# Patient Record
Sex: Male | Born: 1970 | Race: Black or African American | Hispanic: No | Marital: Single | Smoking: Current every day smoker
Health system: Southern US, Community
[De-identification: ages and names within clinical notes are randomized; demographics above are authoritative.]

## PROBLEM LIST (undated history)

## (undated) DIAGNOSIS — E7800 Pure hypercholesterolemia, unspecified: Secondary | ICD-10-CM

## (undated) DIAGNOSIS — D171 Benign lipomatous neoplasm of skin and subcutaneous tissue of trunk: Secondary | ICD-10-CM

## (undated) DIAGNOSIS — R519 Headache, unspecified: Secondary | ICD-10-CM

## (undated) DIAGNOSIS — N19 Unspecified kidney failure: Secondary | ICD-10-CM

## (undated) DIAGNOSIS — N179 Acute kidney failure, unspecified: Secondary | ICD-10-CM

## (undated) DIAGNOSIS — R51 Headache: Secondary | ICD-10-CM

## (undated) HISTORY — PX: OTHER SURGICAL HISTORY: SHX169

---
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
## 1997-09-01 ENCOUNTER — Emergency Department (HOSPITAL_COMMUNITY): Admission: EM | Admit: 1997-09-01 | Discharge: 1997-09-01 | Payer: Self-pay | Admitting: Emergency Medicine

## 1999-07-13 ENCOUNTER — Emergency Department (HOSPITAL_COMMUNITY): Admission: EM | Admit: 1999-07-13 | Discharge: 1999-07-13 | Payer: Self-pay | Admitting: Emergency Medicine

## 2001-11-10 ENCOUNTER — Emergency Department (HOSPITAL_COMMUNITY): Admission: EM | Admit: 2001-11-10 | Discharge: 2001-11-10 | Payer: Self-pay | Admitting: Emergency Medicine

## 2003-07-22 ENCOUNTER — Emergency Department (HOSPITAL_COMMUNITY): Admission: EM | Admit: 2003-07-22 | Discharge: 2003-07-22 | Payer: Self-pay | Admitting: Emergency Medicine

## 2004-06-27 ENCOUNTER — Emergency Department (HOSPITAL_COMMUNITY): Admission: EM | Admit: 2004-06-27 | Discharge: 2004-06-27 | Payer: Self-pay | Admitting: Family Medicine

## 2004-06-29 ENCOUNTER — Emergency Department (HOSPITAL_COMMUNITY): Admission: EM | Admit: 2004-06-29 | Discharge: 2004-06-29 | Payer: Self-pay | Admitting: Family Medicine

## 2005-11-27 ENCOUNTER — Emergency Department (HOSPITAL_COMMUNITY): Admission: EM | Admit: 2005-11-27 | Discharge: 2005-11-27 | Payer: Self-pay | Admitting: Emergency Medicine

## 2005-12-04 ENCOUNTER — Ambulatory Visit: Payer: Self-pay | Admitting: Nurse Practitioner

## 2005-12-06 ENCOUNTER — Ambulatory Visit (HOSPITAL_COMMUNITY): Admission: RE | Admit: 2005-12-06 | Discharge: 2005-12-06 | Payer: Self-pay | Admitting: Family Medicine

## 2006-01-01 ENCOUNTER — Ambulatory Visit (HOSPITAL_BASED_OUTPATIENT_CLINIC_OR_DEPARTMENT_OTHER): Admission: RE | Admit: 2006-01-01 | Discharge: 2006-01-01 | Payer: Self-pay | Admitting: General Surgery

## 2006-01-01 ENCOUNTER — Encounter (INDEPENDENT_AMBULATORY_CARE_PROVIDER_SITE_OTHER): Payer: Self-pay | Admitting: *Deleted

## 2006-10-25 ENCOUNTER — Emergency Department (HOSPITAL_COMMUNITY): Admission: EM | Admit: 2006-10-25 | Discharge: 2006-10-25 | Payer: Self-pay | Admitting: Emergency Medicine

## 2010-03-26 ENCOUNTER — Emergency Department (HOSPITAL_COMMUNITY)
Admission: EM | Admit: 2010-03-26 | Discharge: 2010-03-26 | Payer: Self-pay | Source: Home / Self Care | Admitting: Emergency Medicine

## 2010-07-05 ENCOUNTER — Inpatient Hospital Stay (INDEPENDENT_AMBULATORY_CARE_PROVIDER_SITE_OTHER)
Admission: RE | Admit: 2010-07-05 | Discharge: 2010-07-05 | Disposition: A | Discharge status: Home / Self Care | Payer: Self-pay | Source: Ambulatory Visit | Attending: Family Medicine | Admitting: Family Medicine

## 2010-07-05 DIAGNOSIS — R6889 Other general symptoms and signs: Secondary | ICD-10-CM

## 2010-07-06 LAB — GC/CHLAMYDIA PROBE AMP, GENITAL: Chlamydia, DNA Probe: NEGATIVE

## 2010-07-13 NOTE — Op Note (Signed)
NAMELARZ, MARK                 ACCOUNT NO.:  [id number]   MEDICAL RECORD NO.:  [id number]          PATIENT TYPE:  AMB   LOCATION:  [facility]                         FACILITY:  [facility]   PHYSICIAN:  Anselm Pancoast. Weatherly, M.D.DATE OF BIRTH:  1970/09/09   DATE OF PROCEDURE:  01/01/2006  DATE OF DISCHARGE:                                 OPERATIVE REPORT   PREOPERATIVE DIAGNOSIS:  Intramuscular large lipoma, abdominal wall, right  lower quadrant.   OPERATION:  Excision of large intramuscular lipoma.   General anesthesia.   SURGEON:  Anselm Pancoast. Zachery Dakins, M.D.   HISTORY:  Charles Wise is a 40 year old male who for the last year or so, he  has noticed an obvious bulge at the right groin when he would strain.  I  think he was incarcerated recently because of child support and was doing  some heavy lifting [place].  He said he had pain in the  area.  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  He was seen in the ER and he has also  been seen at [facility], and someone had ordered a CT that showed this is a  large lipoma located deep to the external oblique, under the internal  oblique but more lateral than a usual spigelian hernia area, and looked like  a large only lipoma.  There was no evidence of any loops of bowel or  anything in the area.  He was referred to my office after having the CT, and  he was seen, I think, actually at [facility], I am not sure why they  did a CT at that location, and I saw him in the office earlier in the week.  This is an area about the size of a lemon that is obviously lateral to the  rectus, located down a little higher than the true inguinal hernia, kind of  in the area of McBurney's area.  I recommended that we remove this under  general anesthesia.  If we would actually find a spigelian hernia, I  discussed about the possibilities of placing mesh but on the CT, there is  no  evidence of any loops of bowel up in the intra-abdominal area and also on  physical exam, you cannot feel any squishy like an actual hernia but you can  feel definitely this fatty consistency with straining.  His laboratory  studies are normal and he has not had any nausea or vomiting or other kind  of intra-abdominal-type symptoms.   Preoperatively he was given a gram of Ancef, induction of general anesthesia  with an LOA tube.  The area had been marked.  I initialized the area and the  permit, etc. were signed.  I thin made an oblique incision just like,  really, you are doing an appendectomy down through the thin layer of adipose  tissue.  Then the external oblique was opened right over the area and then  using an [employer] retractor, you could feel obviously the lipoma, and  this,  the most lateral little finger path of the area goes up under the internal  oblique.  The transversalis is definitely deep to the area and the area was  completely separated from the surrounding muscles.  The blood supply, etc.,  comes out on the inferior aspect, which was clamped between [facility] and  ligated with 3-0 Vicryl, and then after the area was completely excised, I  found no evidence of any hernia sac or evidence of a spigelian hernia.  I  closed the deeper layers, sort of reinforcing the internal oblique, kind of  pulling the muscles together with some 2-0 Vicryl sutures and then closed  the external oblique that was open about probably a 3 inch incision with an  0 Vicryl running suture.  Scarpa fascia was approximated with 4-0 Vicryl and  4-0 Vicryl subcuticular sutures and then Benzoin and Steri-Strips on the  skin.  I had anesthetized the muscle layer with approximately 20 mL of 0.25%  Marcaine with adrenalin for immediate postoperative pain and the patient  tolerated the procedure nicely.  He will be released after a short  stay, and he is trying to seek additional work, probably as a  Copy, but I  would like him to wait about 3 weeks before he resumes kind of strenuous  activities.  The patient will be given Vicodin and no additional antibiotics  will be needed.  I will see him back in the office next week.           ______________________________  Anselm Pancoast. Zachery Dakins, M.D.     WJW/MEDQ  D:  01/01/2006  T:  01/01/2006  Job:  [id number]   cc:   Maurice March, M.D.  Fax: [phone]

## 2010-12-07 LAB — RAPID URINE DRUG SCREEN, HOSP PERFORMED
Amphetamines: NOT DETECTED
Barbiturates: NOT DETECTED
Benzodiazepines: NOT DETECTED

## 2011-03-05 ENCOUNTER — Encounter: Payer: Self-pay | Admitting: Cardiology

## 2011-03-05 ENCOUNTER — Emergency Department (INDEPENDENT_AMBULATORY_CARE_PROVIDER_SITE_OTHER)
Admission: EM | Admit: 2011-03-05 | Discharge: 2011-03-05 | Disposition: A | Discharge status: Home / Self Care | Payer: Self-pay | Source: Home / Self Care

## 2011-03-05 DIAGNOSIS — S46219A Strain of muscle, fascia and tendon of other parts of biceps, unspecified arm, initial encounter: Secondary | ICD-10-CM

## 2011-03-05 DIAGNOSIS — S43499A Other sprain of unspecified shoulder joint, initial encounter: Secondary | ICD-10-CM

## 2011-03-05 MED ORDER — IBUPROFEN 800 MG PO TABS: 800.0000 mg | ORAL_TABLET | Freq: Three times a day (TID) | ORAL | Status: AC

## 2011-03-05 MED ORDER — TRAMADOL HCL 50 MG PO TABS: 50.0000 mg | ORAL_TABLET | Freq: Four times a day (QID) | ORAL | Status: AC | PRN

## 2011-03-05 NOTE — ED Notes (Signed)
Left arm pain and shoulder pain that started yesterday. Pt notices decreased ROM. Any movement causes more pain. Denies injury. Pain started after lifting plates and a couch bed. Pt points to inner aspect of left forearm and states it is numb. Normal sensation to the posterior side of the arm.

## 2011-03-05 NOTE — ED Provider Notes (Signed)
Medical screening examination/treatment/procedure(s) were performed by non-physician practitioner and as supervising physician I was immediately available for consultation/collaboration.  Corrie Mckusick, MD 03/05/11 2213

## 2011-03-05 NOTE — ED Provider Notes (Signed)
History     CSN: [id number]  Arrival date & time 03/05/11  1707   None     Chief Complaint  Patient presents with  . Shoulder Pain  . Arm Pain    (Consider location/radiation/quality/duration/timing/severity/associated sxs/prior treatment) HPI Comments: Pain began 2 days ago Lt anterior elbow area. Worsened yesterday. Pain sometimes radiates up arm to shoulder area. Pain is worsened with movement of elbow. Pt reports numbness of ventral forearm only to touch. Denies injury but pain began the day after helping a family member move and doing heavy lifting. He has not taken anything for his discomfort.  The history is provided by the patient.    History reviewed. No pertinent past medical history.  History reviewed. No pertinent past surgical history.  History reviewed. No pertinent family history.  History  Substance Use Topics  . Smoking status: Not on file  . Smokeless tobacco: Not on file  . Alcohol Use: Not on file      Review of Systems  HENT: Negative for neck pain.   Musculoskeletal: Negative for back pain and joint swelling.  Neurological: Negative for weakness.    Allergies  Review of patient's allergies indicates no known allergies.  Home Medications   Current Outpatient Rx  Name Route Sig Dispense Refill  . IBUPROFEN 800 MG PO TABS Oral Take 1 tablet (800 mg total) by mouth 3 (three) times daily. 15 tablet 0  . TRAMADOL HCL 50 MG PO TABS Oral Take 1 tablet (50 mg total) by mouth every 6 (six) hours as needed for pain. 8 tablet 0    BP 106/55  Pulse 59  Temp(Src) 98.2 F (36.8 C) (Oral)  Resp 12  SpO2 98%  Physical Exam  Nursing note and vitals reviewed. Constitutional: He appears well-developed and well-nourished. No distress.  Cardiovascular: Normal rate, regular rhythm and normal heart sounds.   Pulses:      Radial pulses are 2+ on the right side, and 2+ on the left side.       Cap refill < 2 sec bilat.   Pulmonary/Chest: Effort normal and  breath sounds normal. No respiratory distress.  Musculoskeletal:       Left elbow: He exhibits decreased range of motion (pt refuses flexion due to pain). He exhibits no swelling, no effusion, no deformity and no laceration. tenderness (TTP distal bicep) found. No radial head, no medial epicondyle, no lateral epicondyle and no olecranon process tenderness noted.       Rt proximal forearm measures 18.5 cm, and Lt proximal forearm measures 18.0 cm.   Neurological: He is alert.  Skin: Skin is warm and dry. No erythema.  Psychiatric: He has a normal mood and affect.    ED Course  Procedures (including critical care time)  Labs Reviewed - No data to display No results found.   1. Sprain, bicep       MDM  Lt anterior elbow pain with tender bicep. Onset of pain day after heavy lifting.         Melody Comas, [place] 03/05/11 2046

## 2011-03-08 NOTE — ED Notes (Signed)
Pt. called and requested a " referral." He said he showed his papers to his boss but it did not have a " referral." I asked him if he meant a work note and he said yes. I asked him if he asked the PA for one when he was here and he said yes but she did not give him one. Discussed with Esperanza Sheets and she said the pt. told her he was off work until [employer]. So it was not necessary to do work note. If note is needed to have pt. return to work 1/10 without restrictions. Pt. notified what note will say and he can pick it up at the front desk. Vassie Moselle 03/08/2011

## 2011-10-11 ENCOUNTER — Encounter (HOSPITAL_COMMUNITY): Payer: Self-pay | Admitting: *Deleted

## 2011-10-11 ENCOUNTER — Emergency Department (INDEPENDENT_AMBULATORY_CARE_PROVIDER_SITE_OTHER)
Admission: EM | Admit: 2011-10-11 | Discharge: 2011-10-11 | Disposition: A | Discharge status: Nursing Home (Includes ICF) | Payer: Self-pay | Source: Home / Self Care | Attending: Family Medicine | Admitting: Family Medicine

## 2011-10-11 ENCOUNTER — Emergency Department (HOSPITAL_COMMUNITY)
Admission: EM | Admit: 2011-10-11 | Discharge: 2011-10-11 | Disposition: A | Discharge status: Home / Self Care | Payer: Self-pay | Attending: Emergency Medicine | Admitting: Emergency Medicine

## 2011-10-11 DIAGNOSIS — F17200 Nicotine dependence, unspecified, uncomplicated: Secondary | ICD-10-CM | POA: Insufficient documentation

## 2011-10-11 DIAGNOSIS — M542 Cervicalgia: Secondary | ICD-10-CM | POA: Insufficient documentation

## 2011-10-11 DIAGNOSIS — M5382 Other specified dorsopathies, cervical region: Secondary | ICD-10-CM | POA: Insufficient documentation

## 2011-10-11 DIAGNOSIS — M79609 Pain in unspecified limb: Secondary | ICD-10-CM | POA: Insufficient documentation

## 2011-10-11 DIAGNOSIS — R51 Headache: Secondary | ICD-10-CM

## 2011-10-11 LAB — CBC WITH DIFFERENTIAL/PLATELET
Hemoglobin: 14.4 g/dL (ref 13.0–17.0)
Lymphocytes Relative: 39 % (ref 12–46)
Lymphs Abs: 3.3 10*3/uL (ref 0.7–4.0)
Monocytes Relative: 11 % (ref 3–12)
Neutro Abs: 3.8 10*3/uL (ref 1.7–7.7)
Neutrophils Relative %: 46 % (ref 43–77)
Platelets: 249 10*3/uL (ref 150–400)
RBC: 4.66 MIL/uL (ref 4.22–5.81)
WBC: 8.3 10*3/uL (ref 4.0–10.5)

## 2011-10-11 LAB — BASIC METABOLIC PANEL
BUN: 10 mg/dL (ref 6–23)
CO2: 26 mEq/L (ref 19–32)
Chloride: 104 mEq/L (ref 96–112)
GFR calc non Af Amer: >90 mL/min (ref >90)
Glucose, Bld: 80 mg/dL (ref 70–99)
Potassium: 4.5 mEq/L (ref 3.5–5.1)
Sodium: 139 mEq/L (ref 135–145)

## 2011-10-11 MED ORDER — IBUPROFEN 200 MG PO TABS: 600.0000 mg | ORAL_TABLET | Freq: Four times a day (QID) | ORAL | Status: AC | PRN

## 2011-10-11 MED ORDER — METOCLOPRAMIDE HCL 5 MG/ML IJ SOLN
10.0000 mg | Freq: Once | INTRAMUSCULAR | Status: AC
  Administered 2011-10-11: 10 mg via INTRAVENOUS
  Filled 2011-10-11: qty 2

## 2011-10-11 MED ORDER — KETOROLAC TROMETHAMINE 30 MG/ML IJ SOLN
30.0000 mg | Freq: Once | INTRAMUSCULAR | Status: AC
  Administered 2011-10-11: 30 mg via INTRAVENOUS
  Filled 2011-10-11: qty 1

## 2011-10-11 NOTE — ED Notes (Signed)
pT  REPORTS  4  DAYS  OF  THE  WORST  HEADACHE  OF  HIS  LIFE  UNRELEIVED  BY  OTC  MEDS     REPORTS  SOME   NAUSEA  NO  VOMITING    SLIGHT  PHOTOSENSITIVITY  R  EYE        STES  BACK OF  NECK SEEMS  STIFF   PEARLA

## 2011-10-11 NOTE — ED Provider Notes (Signed)
History     CSN: [id number]  Arrival date & time 10/11/11  1509   First MD Initiated Contact with Patient 10/11/11 1527      Chief Complaint  Patient presents with  . Headache    (Consider location/radiation/quality/duration/timing/severity/associated sxs/prior treatment) Patient is a 41 y.o. male presenting with headaches. The history is provided by the patient.  Headache The primary symptoms include headaches and nausea. Primary symptoms do not include dizziness or fever. The symptoms began 3 to 5 days ago. The symptoms are worsening. The neurological symptoms are diffuse.  The headache is associated with neck stiffness. The headache is not associated with aura or photophobia.  Additional symptoms include neck stiffness. Additional symptoms do not include photophobia or aura.    History reviewed. No pertinent past medical history.  History reviewed. No pertinent past surgical history.  No family history on file.  History  Substance Use Topics  . Smoking status: Current Everyday Smoker -- 1.0 packs/day  . Smokeless tobacco: Not on file  . Alcohol Use: No      Review of Systems  Constitutional: Positive for activity change and appetite change. Negative for fever and fatigue.  HENT: Positive for neck stiffness.   Eyes: Negative for photophobia.  Respiratory: Negative.   Cardiovascular: Negative.   Gastrointestinal: Positive for nausea.  Neurological: Positive for headaches. Negative for dizziness, syncope and speech difficulty.    Allergies  Review of patient's allergies indicates no known allergies.  Home Medications  No current outpatient prescriptions on file.  BP 117/53  Pulse 67  Temp 98.4 F (36.9 C) (Oral)  Resp 16  SpO2 100%  Physical Exam  Nursing note and vitals reviewed. Constitutional: He is oriented to person, place, and time. He appears well-developed and well-nourished. He appears distressed.  HENT:  Head: Normocephalic.  Right Ear:  External ear normal.  Left Ear: External ear normal.  Mouth/Throat: Oropharynx is clear and moist.  Eyes: Conjunctivae and EOM are normal.  Neck: Trachea normal and normal range of motion. Muscular tenderness present. Carotid bruit is not present. No rigidity. Kernig's sign noted. No Brudzinski's sign noted.  Musculoskeletal: Normal range of motion.  Neurological: He is alert and oriented to person, place, and time. No cranial nerve deficit. Coordination normal.  Skin: Skin is warm and dry.    ED Course  Procedures (including critical care time)  Labs Reviewed - No data to display No results found.   No diagnosis found.    MDM          Linna Hoff, MD 10/11/11 [phone]

## 2011-10-11 NOTE — ED Provider Notes (Signed)
History     CSN: [id number]  Arrival date & time 10/11/11  1557   First MD Initiated Contact with Patient 10/11/11 1813      Chief Complaint  Patient presents with  . Headache  . Neck Pain  . Leg Pain    numbness    (Consider location/radiation/quality/duration/timing/severity/associated sxs/prior treatment) HPI This is a 41 year old man without any significant past medical history who presents with worsening headaches for 3-4 days. He reports that the headaches are associated with neck pain that stretches all the way down to his shoulders bilaterally. He denies history of fever, but reports feeling a bit chilly this morning. The headache is about 8/10 and increased by rapid change of position. It is mainly frontal and slow and pounding in nature and sharp. He has no history of vomiting, but reports feeling nauseated. There is no change in appetite. Patient denies history of photophobia, but reports feeling numbness in his lower extremities bilaterally. He does not have dizziness, loss of consciousness or seizures. He has tried using over-the-counter medicines, including Tylenol, and aspirin without any improvement in his headache. This is the first time he has had such headaches. However, he reports that he has in the past experienced migraine headaches, but they have not been like this. He denies any rash, history of recent travel or sick contacts. No history of head trauma.    History reviewed. No pertinent past medical history.  History reviewed. No pertinent past surgical history.  History reviewed. No pertinent family history.  History  Substance Use Topics  . Smoking status: Current Everyday Smoker -- 1.0 packs/day  . Smokeless tobacco: Not on file  . Alcohol Use: No      Review of Systems  Constitutional: Positive for appetite change and fatigue.  HENT: Negative for hearing loss, ear pain, nosebleeds, congestion, sore throat, facial swelling, rhinorrhea, sneezing,  drooling, mouth sores, trouble swallowing, dental problem, voice change, postnasal drip, sinus pressure, tinnitus and ear discharge.   Eyes: Negative for visual disturbance.  Respiratory: Negative.  Negative for apnea.   Cardiovascular: Negative.   Gastrointestinal: Negative for vomiting, abdominal pain, abdominal distention and rectal pain.  Genitourinary: Negative.   Musculoskeletal: Negative for myalgias, back pain, joint swelling, arthralgias and gait problem.  Neurological: Positive for dizziness and light-headedness. Negative for tremors, seizures, syncope, facial asymmetry, speech difficulty and weakness.  Psychiatric/Behavioral: Negative.     Allergies  Review of patient's allergies indicates no known allergies.  Home Medications  No current outpatient prescriptions on file.  BP 131/42  Pulse 58  Temp 98.3 F (36.8 C) (Oral)  Resp 16  SpO2 99%  Physical Exam  Constitutional: He is oriented to person, place, and time. He appears well-developed and well-nourished.  HENT:  Head: Normocephalic and atraumatic.  Eyes: Conjunctivae and EOM are normal. Pupils are equal, round, and reactive to light.  Neck: Normal range of motion. Muscular tenderness present. Rigidity present. No edema, no erythema and normal range of motion present. No Kernig's sign noted.    Cardiovascular: Normal rate, regular rhythm and normal heart sounds.  Exam reveals no gallop and no friction rub.   No murmur heard. Abdominal: Soft. Bowel sounds are normal.  Musculoskeletal: He exhibits no edema and no tenderness.  Neurological: He is alert and oriented to person, place, and time. He displays no atrophy, no tremor and normal reflexes. No sensory deficit. He exhibits normal muscle tone. He displays no seizure activity. GCS eye subscore is 4. GCS verbal subscore is  5. GCS motor subscore is 6.  Skin: Skin is warm and dry. No rash noted.  Psychiatric: He has a normal mood and affect. His behavior is normal.  Thought content normal.    ED Course  Procedures (including critical care time)  Labs Reviewed - No data to display No results found.   No diagnosis found.    MDM  This is a 41 year old man, who presents with a 3 days history of headache with neck pain. He does not report photophobia, chills, or fevers. He has had nausea, but no vomiting. Examination reveals neck stiffness, but no focal neurological deficits, Kernig's sign is negative. Patient's presentation and physical exam, his concerning for a meningitis versus intracranial bleed. However, in this patient both these conditions are unlikely given normal a mental status; no fever or chills to support meningitis and no risk factors or history of head trauma to support intracranial bleed. He does not look toxic. The most likely explanation in this patient is a primary headache disorder and musculoskeletal related headaches. I have prescribed it Ketocolac plus metoclopramide intravenously. I have discussed the management of this patient with Dr. Janey Genta and we agree that we don't need to do further work up for a meningitis or intracranial bleeding with a CT scan or LP at this point. Will proceed and do basic labs including a CBC, BMET, and aPTT/PT.  Dow Adolph, MD 10/11/11 1952  9:41 PM  The patient reports that his pain has significantly reduced after receiving IV metoclopramide and Ketocolac. Patient will be discharged with Motrin and an ice pack.  Dow Adolph, MD 10/11/11 2142

## 2011-10-11 NOTE — ED Notes (Signed)
Pt reports 3 day hx of headache, states started at right temporal and has moved over, states headache unrelieved by OTC meds. Pt reports stiff neck and numbness to bilateral legs. Denies any injuries. Pt sent over from urgent care for further eval. No fevers.

## 2011-10-12 NOTE — ED Provider Notes (Signed)
I saw and evaluated the patient, reviewed the resident's note and I agree with the findings and plan.  Pt comes in with cc of headaches. No concerns for life threatening secondary headaches because of no neuro findings, no meningeal signs and no fevers, chills. Pt does have some medial scapular region tenderness, worse with palpation. Likely a tension h/a. Headaches is 2/10 at MD eval, came in with a severity of 7/10. Will d/c.  Derwood Kaplan, MD 10/12/11 [phone]

## 2012-12-21 ENCOUNTER — Emergency Department (HOSPITAL_COMMUNITY)
Admission: EM | Admit: 2012-12-21 | Discharge: 2012-12-21 | Disposition: A | Discharge status: Home / Self Care | Payer: Self-pay | Attending: Emergency Medicine | Admitting: Emergency Medicine

## 2012-12-21 ENCOUNTER — Encounter (HOSPITAL_COMMUNITY): Payer: Self-pay | Admitting: Emergency Medicine

## 2012-12-21 DIAGNOSIS — R109 Unspecified abdominal pain: Secondary | ICD-10-CM

## 2012-12-21 DIAGNOSIS — F439 Reaction to severe stress, unspecified: Secondary | ICD-10-CM

## 2012-12-21 DIAGNOSIS — F17200 Nicotine dependence, unspecified, uncomplicated: Secondary | ICD-10-CM | POA: Insufficient documentation

## 2012-12-21 DIAGNOSIS — R1032 Left lower quadrant pain: Secondary | ICD-10-CM | POA: Insufficient documentation

## 2012-12-21 DIAGNOSIS — F430 Acute stress reaction: Secondary | ICD-10-CM | POA: Insufficient documentation

## 2012-12-21 DIAGNOSIS — N529 Male erectile dysfunction, unspecified: Secondary | ICD-10-CM | POA: Insufficient documentation

## 2012-12-21 LAB — CBC
HCT: 45.4 % (ref 39.0–52.0)
Hemoglobin: 15.5 g/dL (ref 13.0–17.0)
MCV: 89.5 fL (ref 78.0–100.0)
Platelets: 236 10*3/uL (ref 150–400)
RBC: 5.07 MIL/uL (ref 4.22–5.81)
WBC: 5.7 10*3/uL (ref 4.0–10.5)

## 2012-12-21 LAB — COMPREHENSIVE METABOLIC PANEL
AST: 23 U/L (ref 0–37)
CO2: 26 mEq/L (ref 19–32)
Chloride: 100 mEq/L (ref 96–112)
Creatinine, Ser: 0.87 mg/dL (ref 0.50–1.35)
GFR calc Af Amer: >90 mL/min (ref >90)
GFR calc non Af Amer: >90 mL/min (ref >90)
Glucose, Bld: 86 mg/dL (ref 70–99)
Total Bilirubin: 0.4 mg/dL (ref 0.3–1.2)

## 2012-12-21 LAB — URINALYSIS, ROUTINE W REFLEX MICROSCOPIC
Bilirubin Urine: NEGATIVE
Hgb urine dipstick: NEGATIVE
Protein, ur: NEGATIVE mg/dL
Urobilinogen, UA: 0.2 mg/dL (ref 0.0–1.0)

## 2012-12-21 NOTE — ED Notes (Signed)
Pt reports abdominal pain since Sat. Also sts he needs help with erectile dysfunction.

## 2012-12-21 NOTE — Progress Notes (Signed)
[facility] provided pt with a list of primary care resources.  °

## 2012-12-21 NOTE — ED Provider Notes (Signed)
CSN: [id number]     Arrival date & time 12/21/12  1446 History   First MD Initiated Contact with Patient 12/21/12 1505     Chief Complaint  Patient presents with  . Abdominal Pain  . Erectile Dysfunction   (Consider location/radiation/quality/duration/timing/severity/associated sxs/prior Treatment) Patient is a 42 y.o. male presenting with abdominal pain and erectile dysfunction. The history is provided by the patient.  Abdominal Pain Associated symptoms: no chest pain, no cough, no fever, no shortness of breath, no sore throat and no vomiting   Erectile Dysfunction Associated symptoms include abdominal pain. Pertinent negatives include no chest pain, no headaches and no shortness of breath.  pt c/o intermittent left lower abdominal pain for the past 2-3 days. Dull, cramping, mild, comes and goes without specific exacerbating or alleviating factors. No radiation. Normal appetite. No nv. No diarrhea or constipation. States bms a little irregular lately, but had bm today, appeared normal. No abd distension or prior abd surgery. No dysuria or hematuria. No back or flank pain. No fever or chills. Also states for 'long while' some problems w ED. States is able to get erection but then early during intercourse looses erection. States around onset symptoms had experienced loss of job, and has been thinking a lot about that lately, +stress. Denies depression. Denies any medication or drug use. No back pain. No perineal or leg numbness/weakness. No incontincence.      History reviewed. No pertinent past medical history. History reviewed. No pertinent past surgical history. History reviewed. No pertinent family history. History  Substance Use Topics  . Smoking status: Current Every Day Smoker -- 1.00 packs/day  . Smokeless tobacco: Not on file  . Alcohol Use: No    Review of Systems  Constitutional: Negative for fever.  HENT: Negative for sore throat.   Eyes: Negative for redness.   Respiratory: Negative for cough and shortness of breath.   Cardiovascular: Negative for chest pain.  Gastrointestinal: Positive for abdominal pain. Negative for vomiting.  Genitourinary: Negative for flank pain, scrotal swelling, penile pain and testicular pain.  Musculoskeletal: Negative for back pain and neck pain.  Skin: Negative for rash.  Neurological: Negative for weakness, numbness and headaches.  Hematological: Does not bruise/bleed easily.  Psychiatric/Behavioral: Negative for confusion.    Allergies  Review of patient's allergies indicates no known allergies.  Home Medications  No current outpatient prescriptions on file. BP 118/68  Pulse 69  Temp(Src) 98.3 F (36.8 C)  Resp 18  SpO2 98% Physical Exam  Nursing note and vitals reviewed. Constitutional: He is oriented to person, place, and time. He appears well-developed and well-nourished. No distress.  HENT:  Head: Atraumatic.  Mouth/Throat: Oropharynx is clear and moist.  Eyes: Conjunctivae are normal. Pupils are equal, round, and reactive to light. No scleral icterus.  Neck: Neck supple. No tracheal deviation present.  Cardiovascular: Normal rate and intact distal pulses.   Pulmonary/Chest: Effort normal and breath sounds normal. No accessory muscle usage. No respiratory distress.  Abdominal: Soft. Bowel sounds are normal. He exhibits no distension and no mass. There is no tenderness. There is no rebound and no guarding.  Genitourinary:  No cva tenderness.   Musculoskeletal: Normal range of motion. He exhibits no edema and no tenderness.  Neurological: He is alert and oriented to person, place, and time.  Steady gait  Skin: Skin is warm and dry.  Psychiatric: He has a normal mood and affect.    ED Course  Procedures (including critical care time) Labs Review  Results for orders placed during the hospital encounter of 12/21/12  CBC      Result Value Range   WBC 5.7  4.0 - 10.5 K/uL   RBC 5.07  4.22 -  5.81 MIL/uL   Hemoglobin 15.5  13.0 - 17.0 g/dL   HCT 16.1  09.6 - 04.5 %   MCV 89.5  78.0 - 100.0 fL   MCH 30.6  26.0 - 34.0 pg   MCHC 34.1  30.0 - 36.0 g/dL   RDW 40.9  81.1 - 91.4 %   Platelets 236  150 - 400 K/uL  COMPREHENSIVE METABOLIC PANEL      Result Value Range   Sodium 136  135 - 145 mEq/L   Potassium 4.4  3.5 - 5.1 mEq/L   Chloride 100  96 - 112 mEq/L   CO2 26  19 - 32 mEq/L   Glucose, Bld 86  70 - 99 mg/dL   BUN 12  6 - 23 mg/dL   Creatinine, Ser 7.82  0.50 - 1.35 mg/dL   Calcium 9.7  8.4 - 95.6 mg/dL   Total Protein 8.0  6.0 - 8.3 g/dL   Albumin 4.3  3.5 - 5.2 g/dL   AST 23  0 - 37 U/L   ALT 11  0 - 53 U/L   Alkaline Phosphatase 105  39 - 117 U/L   Total Bilirubin 0.4  0.3 - 1.2 mg/dL   GFR calc non Af Amer >90  >90 mL/min   GFR calc Af Amer >90  >90 mL/min  URINALYSIS, ROUTINE W REFLEX MICROSCOPIC      Result Value Range   Color, Urine YELLOW  YELLOW   APPearance CLEAR  CLEAR   Specific Gravity, Urine 1.019  1.005 - 1.030   pH 5.0  5.0 - 8.0   Glucose, UA NEGATIVE  NEGATIVE mg/dL   Hgb urine dipstick NEGATIVE  NEGATIVE   Bilirubin Urine NEGATIVE  NEGATIVE   Ketones, ur NEGATIVE  NEGATIVE mg/dL   Protein, ur NEGATIVE  NEGATIVE mg/dL   Urobilinogen, UA 0.2  0.0 - 1.0 mg/dL   Nitrite NEGATIVE  NEGATIVE   Leukocytes, UA NEGATIVE  NEGATIVE      EKG Interpretation   None       MDM  Labs.  Reviewed nursing notes and prior charts for additional history.   Labs normal. Exam unremarkable, abd soft nt.  Regarding ED, and pts report of inc stress/anxiety, feel likely more related to that than other/physiologic dysfunction - discussed w pt, pcp follow up to discuss possible rx.  Pt appears stable for d/c.       Suzi Roots, MD 12/21/12 [phone]

## 2013-09-26 ENCOUNTER — Emergency Department (HOSPITAL_COMMUNITY)
Admission: EM | Admit: 2013-09-26 | Discharge: 2013-09-26 | Disposition: A | Discharge status: Home / Self Care | Payer: Self-pay | Source: Home / Self Care | Attending: Family Medicine | Admitting: Family Medicine

## 2013-09-26 ENCOUNTER — Encounter (HOSPITAL_COMMUNITY): Payer: Self-pay | Admitting: Emergency Medicine

## 2013-09-26 DIAGNOSIS — S39011A Strain of muscle, fascia and tendon of abdomen, initial encounter: Secondary | ICD-10-CM

## 2013-09-26 DIAGNOSIS — IMO0002 Reserved for concepts with insufficient information to code with codable children: Secondary | ICD-10-CM

## 2013-09-26 NOTE — ED Notes (Signed)
Knot at mid rib on right side.   States was doing heavy lifting at work on Monday and noticed a knot formed on right side.  Having pain with deep breathing.  Denies any known injury.

## 2013-09-26 NOTE — ED Provider Notes (Signed)
Charles Wise is a 43 y.o. male who presents to Urgent Care today for abdominal mass. Patient has a 6 day history of right-sided abdominal pain and mass. Symptoms started while at work. He does heavy lifting in a warehouse and notes onset of right-sided oblique abdominal pain and swelling. The symptoms have persisted. The pain is on is worse with standing and lifting and turning. He denies any bowel bladder dysfunction difficult to walking fevers or chills nausea vomiting or diarrhea. He's tried some Aleve which helps a little. He is more concerned about the nature of the swelling that he has the pain.   History reviewed. No pertinent past medical history. History  Substance Use Topics  . Smoking status: Current Every Day Smoker -- 1.00 packs/day  . Smokeless tobacco: Not on file  . Alcohol Use: No   ROS as above Medications: No current facility-administered medications for this encounter.   Current Outpatient Prescriptions  Medication Sig Dispense Refill  . aspirin 325 MG tablet Take 325 mg by mouth daily.      . naproxen sodium (ANAPROX) 220 MG tablet Take 220 mg by mouth 2 (two) times daily with a meal.        Exam:  BP 120/53  Temp(Src) 98.5 F (36.9 C) (Oral)  Resp 12  SpO2 99% Gen: Well NAD HEENT: EOMI,  MMM Lungs: Normal work of breathing. CTABL Heart: RRR no MRG Abd: NABS, Soft. Nondistended, Nontender. With standing slight bulging at the right oblique. Nontender. Bulging worse with abdominal muscle contraction. Exts: Brisk capillary refill, warm and well perfused.   No results found for this or any previous visit (from the past 24 hour(s)). No results found.  Assessment and Plan: 43 y.o. male with oblique strain/tear. Doubtful for abdominal wall herniation.  Plan for abdominal wall binder and NSAIDS for pain.  F/u if not better.   Discussed warning signs or symptoms. Please see discharge instructions. Patient expresses understanding.   This note was created using  Systems analyst. Any transcription errors are unintended.    Gregor Hams, MD 09/26/13 [phone]

## 2013-09-26 NOTE — Discharge Instructions (Signed)
Thank you for coming in today. Use the abdominal binder as needed.  Use aleve or ibuprofen for pain as needed.  Come back if not better\  If your belly pain worsens, or you have high fever, bad vomiting, blood in your stool or black tarry stool go to the Emergency Room.    I think you injured your oblique abdominal muscle Come back as needed

## 2014-03-23 ENCOUNTER — Encounter (HOSPITAL_COMMUNITY): Payer: Self-pay | Admitting: *Deleted

## 2014-03-23 ENCOUNTER — Emergency Department (HOSPITAL_COMMUNITY)
Admission: EM | Admit: 2014-03-23 | Discharge: 2014-03-23 | Disposition: A | Discharge status: Home / Self Care | Payer: BLUE CROSS/BLUE SHIELD | Attending: Emergency Medicine | Admitting: Emergency Medicine

## 2014-03-23 DIAGNOSIS — R109 Unspecified abdominal pain: Secondary | ICD-10-CM

## 2014-03-23 DIAGNOSIS — Z720 Tobacco use: Secondary | ICD-10-CM | POA: Diagnosis not present

## 2014-03-23 DIAGNOSIS — Z79899 Other long term (current) drug therapy: Secondary | ICD-10-CM | POA: Insufficient documentation

## 2014-03-23 DIAGNOSIS — Z791 Long term (current) use of non-steroidal anti-inflammatories (NSAID): Secondary | ICD-10-CM | POA: Insufficient documentation

## 2014-03-23 DIAGNOSIS — Z7982 Long term (current) use of aspirin: Secondary | ICD-10-CM | POA: Insufficient documentation

## 2014-03-23 DIAGNOSIS — R1031 Right lower quadrant pain: Secondary | ICD-10-CM | POA: Diagnosis not present

## 2014-03-23 LAB — COMPREHENSIVE METABOLIC PANEL
ALK PHOS: 100 U/L (ref 39–117)
ALT: 12 U/L (ref 0–53)
ANION GAP: 7 (ref 5–15)
AST: 29 U/L (ref 0–37)
Albumin: 4.2 g/dL (ref 3.5–5.2)
BILIRUBIN TOTAL: 0.9 mg/dL (ref 0.3–1.2)
BUN: 14 mg/dL (ref 6–23)
CALCIUM: 9.3 mg/dL (ref 8.4–10.5)
CHLORIDE: 107 mmol/L (ref 96–112)
CO2: 26 mmol/L (ref 19–32)
Creatinine, Ser: 0.85 mg/dL (ref 0.50–1.35)
GFR calc Af Amer: >90 mL/min (ref >90)
GFR calc non Af Amer: >90 mL/min (ref >90)
GLUCOSE: 91 mg/dL (ref 70–99)
POTASSIUM: 4.7 mmol/L (ref 3.5–5.1)
Sodium: 140 mmol/L (ref 135–145)
TOTAL PROTEIN: 7.5 g/dL (ref 6.0–8.3)

## 2014-03-23 LAB — CBC WITH DIFFERENTIAL/PLATELET
Basophils Absolute: 0 10*3/uL (ref 0.0–0.1)
Basophils Relative: 0 % (ref 0–1)
EOS PCT: 4 % (ref 0–5)
Eosinophils Absolute: 0.4 10*3/uL (ref 0.0–0.7)
HEMATOCRIT: 39.6 % (ref 39.0–52.0)
Hemoglobin: 13.6 g/dL (ref 13.0–17.0)
LYMPHS ABS: 2.7 10*3/uL (ref 0.7–4.0)
LYMPHS PCT: 26 % (ref 12–46)
MCH: 30.1 pg (ref 26.0–34.0)
MCHC: 34.3 g/dL (ref 30.0–36.0)
MCV: 87.6 fL (ref 78.0–100.0)
MONO ABS: 0.8 10*3/uL (ref 0.1–1.0)
Monocytes Relative: 7 % (ref 3–12)
Neutro Abs: 6.6 10*3/uL (ref 1.7–7.7)
Neutrophils Relative %: 63 % (ref 43–77)
PLATELETS: 269 10*3/uL (ref 150–400)
RBC: 4.52 MIL/uL (ref 4.22–5.81)
RDW: 14.3 % (ref 11.5–15.5)
WBC: 10.4 10*3/uL (ref 4.0–10.5)

## 2014-03-23 LAB — URINALYSIS, ROUTINE W REFLEX MICROSCOPIC
Bilirubin Urine: NEGATIVE
GLUCOSE, UA: NEGATIVE mg/dL
HGB URINE DIPSTICK: NEGATIVE
KETONES UR: NEGATIVE mg/dL
LEUKOCYTES UA: NEGATIVE
Nitrite: NEGATIVE
PH: 6.5 (ref 5.0–8.0)
PROTEIN: NEGATIVE mg/dL
Specific Gravity, Urine: 1.014 (ref 1.005–1.030)
UROBILINOGEN UA: 0.2 mg/dL (ref 0.0–1.0)

## 2014-03-23 LAB — LIPASE, BLOOD: LIPASE: 56 U/L (ref 11–59)

## 2014-03-23 NOTE — ED Notes (Signed)
Pt comfortable with discharge and follow up instructions. Pt declines wheelchair, escorted to waiting area by this RN. No prescriptions. 

## 2014-03-23 NOTE — ED Provider Notes (Signed)
CSN: [id number]     Arrival date & time 03/23/14  1517 History   First MD Initiated Contact with Patient 03/23/14 [phone]     Chief Complaint  Patient presents with  . Abdominal Pain     (Consider location/radiation/quality/duration/timing/severity/associated sxs/prior Treatment) Patient is a 44 y.o. male presenting with abdominal pain.  Abdominal Pain Pain location:  RLQ Pain quality: aching   Pain radiates to:  Does not radiate Pain severity:  Mild Onset quality:  Gradual Duration: 1 year. Timing:  Intermittent Progression:  Waxing and waning Chronicity:  Chronic Context: not eating and not recent illness   Relieved by:  Nothing Worsened by:  Movement and palpation   History reviewed. No pertinent past medical history. Past Surgical History  Procedure Laterality Date  . Left hip surgery  1980s   History reviewed. No pertinent family history. History  Substance Use Topics  . Smoking status: Current Every Day Smoker -- 0.50 packs/day  . Smokeless tobacco: Not on file  . Alcohol Use: No    Review of Systems  Gastrointestinal: Positive for abdominal pain.  All other systems reviewed and are negative.     Allergies  Review of patient's allergies indicates no known allergies.  Home Medications   Prior to Admission medications   Medication Sig Start Date End Date Taking? Authorizing Provider  aspirin 325 MG tablet Take 325 mg by mouth daily.   Yes Historical Provider, MD  Multiple Vitamins-Minerals (MULTIVITAMIN WITH MINERALS) tablet Take 1 tablet by mouth daily.   Yes Historical Provider, MD  naproxen sodium (ANAPROX) 220 MG tablet Take 220 mg by mouth 2 (two) times daily with a meal.   Yes Historical Provider, MD   BP 139/61 mmHg  Pulse 65  Temp(Src) 98.8 F (37.1 C) (Oral)  Resp 16  SpO2 98% Physical Exam  Constitutional: He is oriented to person, place, and time. He appears well-developed and well-nourished.  HENT:  Head: Normocephalic and atraumatic.   Eyes: Conjunctivae and EOM are normal.  Neck: Normal range of motion. Neck supple.  Cardiovascular: Normal rate, regular rhythm and normal heart sounds.   Pulmonary/Chest: Effort normal and breath sounds normal. No respiratory distress.  Abdominal: He exhibits no distension. There is no tenderness. There is no rebound, no guarding and no tenderness at McBurney's point.  Pt has pain overlying costal margin, no CVA tenderness or frank abd pain  Musculoskeletal: Normal range of motion.  Neurological: He is alert and oriented to person, place, and time.  Skin: Skin is warm and dry.  Vitals reviewed.   ED Course  Procedures (including critical care time) Labs Review Labs Reviewed  COMPREHENSIVE METABOLIC PANEL  LIPASE, BLOOD  URINALYSIS, ROUTINE W REFLEX MICROSCOPIC  CBC WITH DIFFERENTIAL/PLATELET  CBC WITH DIFFERENTIAL/PLATELET    Imaging Review No results found.   EKG Interpretation None      MDM   Final diagnoses:  Right flank pain    44 y.o. male without pertinent PMH presents with multiple complaints, including not having a PCP.  His primary medical complaint is pain overlying his inferior costal margin on the right. He denies GI or urinary symptoms. He does state that he also has erectile dysfunction.  On arrival vital signs and physical exam as above. No signs of appendicitis or other emergent pathology on exam. Given that the patient has not seen a physician in some time and has questionable hepatic tenderness will obtain baseline labs.  These were unremarkable.  Pt well appearing.  Explained the importance  of primary care fu.  DC home in stable condition.    I have reviewed all laboratory and imaging studies if ordered as above  1. Right flank pain         Debby Freiberg, MD 03/23/14 1400

## 2014-03-23 NOTE — Discharge Instructions (Signed)
Flank Pain Flank pain refers to pain that is located on the side of the body between the upper abdomen and the back. The pain may occur over a short period of time (acute) or may be long-term or reoccurring (chronic). It may be mild or severe. Flank pain can be caused by many things. CAUSES  Some of the more common causes of flank pain include:  Muscle strains.   Muscle spasms.   A disease of your spine (vertebral disk disease).   A lung infection (pneumonia).   Fluid around your lungs (pulmonary edema).   A kidney infection.   Kidney stones.   A very painful skin rash caused by the chickenpox virus (shingles).   Gallbladder disease.  [place] care will depend on the cause of your pain. In general,  Rest as directed by your caregiver.  Drink enough fluids to keep your urine clear or pale yellow.  Only take over-the-counter or prescription medicines as directed by your caregiver. Some medicines may help relieve the pain.  Tell your caregiver about any changes in your pain.  Follow up with your caregiver as directed. SEEK IMMEDIATE MEDICAL CARE IF:   Your pain is not controlled with medicine.   You have new or worsening symptoms.  Your pain increases.   You have abdominal pain.   You have shortness of breath.   You have persistent nausea or vomiting.   You have swelling in your abdomen.   You feel faint or pass out.   You have blood in your urine.  You have a fever or persistent symptoms for more than 2-3 days.  You have a fever and your symptoms suddenly get worse. MAKE SURE YOU:   Understand these instructions.  Will watch your condition.  Will get help right away if you are not doing well or get worse. Document Released: 04/04/2005 Document Revised: 11/06/2011 Document Reviewed: 09/26/2011 [facility] Patient Information 2015 [place], [place]. This information is not intended to replace advice given to you by your  health care provider. Make sure you discuss any questions you have with your health care provider.  Abdominal Pain Many things can cause abdominal pain. Usually, abdominal pain is not caused by a disease and will improve without treatment. It can often be observed and treated at home. Your health care provider will do a physical exam and possibly order blood tests and X-rays to help determine the seriousness of your pain. However, in many cases, more time must pass before a clear cause of the pain can be found. Before that point, your health care provider may not know if you need more testing or further treatment. HOME CARE INSTRUCTIONS  Monitor your abdominal pain for any changes. The following actions may help to alleviate any discomfort you are experiencing:  Only take over-the-counter or prescription medicines as directed by your health care provider.  Do not take laxatives unless directed to do so by your health care provider.  Try a clear liquid diet (broth, tea, or water) as directed by your health care provider. Slowly move to a bland diet as tolerated. SEEK MEDICAL CARE IF:  You have unexplained abdominal pain.  You have abdominal pain associated with nausea or diarrhea.  You have pain when you urinate or have a bowel movement.  You experience abdominal pain that wakes you in the night.  You have abdominal pain that is worsened or improved by eating food.  You have abdominal pain that is worsened with eating  fatty foods.  You have a fever. SEEK IMMEDIATE MEDICAL CARE IF:   Your pain does not go away within 2 hours.  You keep throwing up (vomiting).  Your pain is felt only in portions of the abdomen, such as the right side or the left lower portion of the abdomen.  You pass bloody or black tarry stools. MAKE SURE YOU:  Understand these instructions.   Will watch your condition.   Will get help right away if you are not doing well or get worse.  Document Released:  11/21/2004 Document Revised: 02/16/2013 Document Reviewed: 10/21/2012 [facility] Patient Information 2015 [place], [place]. This information is not intended to replace advice given to you by your health care provider. Make sure you discuss any questions you have with your health care provider.   Emergency Department Resource Guide 1) Find a Doctor and Pay Out of Pocket Although you won't have to find out who is covered by your insurance plan, it is a good idea to ask around and get recommendations. You will then need to call the office and see if the doctor you have chosen will accept you as a new patient and what types of options they offer for patients who are self-pay. Some doctors offer discounts or will set up payment plans for their patients who do not have insurance, but you will need to ask so you aren't surprised when you get to your appointment.  2) Contact Your Local Health Department Not all health departments have doctors that can see patients for sick visits, but many do, so it is worth a call to see if yours does. If you don't know where your local health department is, you can check in your phone book. The CDC also has a tool to help you locate your state's health department, and many state websites also have listings of all of their local health departments.  3) Find a [facility] If your illness is not likely to be very severe or complicated, you may want to try a walk in clinic. These are popping up all over the country in pharmacies, drugstores, and shopping centers. They're usually staffed by nurse practitioners or physician assistants that have been trained to treat common illnesses and complaints. They're usually fairly quick and inexpensive. However, if you have serious medical issues or chronic medical problems, these are probably not your best option.  No Primary Care Doctor: - Call Health Connect at  [phone] - they can help you locate a primary care doctor that  accepts  your insurance, provides certain services, etc. - Physician Referral Service- [phone]  Chronic Pain Problems: Organization         Address  Phone   Notes  [facility]  [phone] Patients need to be referred by their primary care doctor.   Medication Assistance: Organization         Address  Phone   Notes  [facility] Medication [facility] [address] [place], [place] [address] [phone] --Must be a resident of [facility] -- Must have NO insurance coverage whatsoever (no Medicaid/ Medicare, etc.) -- The pt. MUST have a primary care doctor that directs their care regularly and follows them in the community   MedAssist  [phone]   [employer]  [phone]    Agencies that provide inexpensive medical care: Organization         Address  Phone   Notes  [place]  (  [phone]   [facility]    [phone]   [facility] [place], [place] [address] [phone]   [facility] [address], [place] [phone]   Planned Parenthood    [phone]   [facility]    [phone]   [place] and [place] [address], [place] Phone:  [phone], Fax:  [phone] Hours of Operation:  9 am - 6 pm, M-F.  Also accepts Medicaid/Medicare and self-pay.  [facility] for [place], [address], [place] Phone: [phone], Fax: [phone]. Hours of Operation:  8:30 am - 5:30 pm, M-F.  Also accepts Medicaid and self-pay.  [facility] [place] [address], [place] Phone: [phone]   [place], [place], [place] [phone], Ext. [phone] Mondays & Thursdays: 7-9 AM.  First 15 patients are seen on a first come, first serve basis.    [place] Providers:  Organization          Address  Phone   Notes  [facility] [address], [place] [phone] Also accepts self-pay patients.  [facility] [address], [place]  [phone]   [place], [address], [place] [phone]   [facility] [address], [place] [phone]   Lucianne Lei [address] [place] [address], [place]   [phone] Only accepts [place] Access [place] patients after they have their name applied to their card.   Self-Pay (no insurance) in [facility]:  Organization         Address  Phone   Notes  Sickle Cell Patients, [facility] [place] [phone]   [facility] [place] [phone]   [facility] [place]  [place], [place], [place] [phone]   [facility]/Dr. Osei-Bonsu  [address] [place] or [address], [place] [phone] Phone number for both [place] and [place] locations is the same.  [facility] [address], [place] [phone]   [facility] [address], [place] or [address] [phone]   [facility] [address] [place] [phone], phone; [phone], fax Sees patients 1st and 3rd Saturday of every month.  Must not qualify for public or private insurance (i.e. Medicaid, Medicare, [place] Health Choice, Veterans' Benefits)  Household income should be no more than 200% of the poverty level The clinic cannot treat you if you are pregnant or think you are pregnant  Sexually transmitted diseases are not treated at the clinic.    Dental Care: Organization         Address  Phone  Notes  [facility] Department of [facility] [place] [phone] Accepts children up to age 18 who are enrolled in [place] or [place]; pregnant women with a Medicaid card; and children who have applied for Medicaid or [place] Health Choice, but were declined, whose parents can pay a reduced fee at time of service.  [facility] [place]  [address], [employer] [phone] Accepts children up to age 28 who are enrolled in Medicaid or [place]; pregnant women with a Medicaid card; and children who have applied for Medicaid or [place] Health Choice, but were declined, whose parents can pay a reduced fee at time of service.  [place] Adult Dental Access PROGRAM  [place] [phone] Patients are seen by appointment only. Walk-ins are not accepted. [place] will see patients 28 years of age and older. Monday - Tuesday (8am-5pm) Most Wednesdays (8:30-5pm) $30 per visit, cash only  [facility] Adult Dental Access PROGRAM  [address], [facility] [phone] Patients are seen by appointment only. Walk-ins are not accepted. [place] will see patients 13 years of age and older. One Wednesday Evening (Monthly: Volunteer Based).  $30 per visit, cash only  [place]  [phone] for adults; Children under age 64, call Graduate Pediatric Dentistry at [phone]. Children aged 63-14, please call [phone] to request a pediatric application.  Dental services are provided in all areas of dental care including fillings, crowns and bridges, complete and partial dentures, implants, gum treatment, root canals, and extractions. Preventive care is also provided. Treatment is provided to both adults and children. Patients are selected via a lottery and there is often a waiting list.   [facility] [address] [place]  [phone] [email or web address]   [facility] [address] [place], [place]  [phone], Ext. [phone] Second and Fourth Thursday of each month, opens at 6:30 AM; Clinic ends at 9 AM.  Patients are seen on a first-come first-served basis, and a limited number are seen during each clinic.   [facility]  [address] Hillard Danker [place], [place] [phone]   Eligibility Requirements You must have lived in [place], [place], or [place] counties for at least the last three months.   You cannot be eligible for state or federal sponsored [employer], including [employer], [place], or [employer].   You generally cannot be eligible for healthcare insurance through your employer.    How to apply: Eligibility screenings are held every Tuesday and Wednesday afternoon from 1:00 pm until 4:00 pm. You do not need an appointment for the interview!  [facility] [address], [place], [place]   [place]  [place] Department  [place]  [phone]    Behavioral Health Resources in the Community: Intensive Outpatient Programs Organization         Address  Phone  Notes  [place]wynn [place]. [address] [place], [place] [phone]   [facility] [address], [place], [place]   ADS: Alcohol & Drug Svcs [address] [place], [place]   [place] [address],  [place], [place] or [phone]   Substance Abuse Resources Organization         Address  Phone  Notes  Alcohol and Drug Services  [phone]   [place]  [phone]   [facility] [place]   Chinita Pester  [phone]   Residential & Outpatient Substance Abuse Program  [phone]   Psychological Services Organization         Address  Phone  Notes  [place]  [place]  Services  [place] [address], [place] or [phone]    Mobile Crisis Teams Organization         Address  Phone  Notes  [facility], Mobile Crisis Care Unit  [phone]   [facility]  [address] [place], [place]   [place] Levels [address], [place] [phone]    Self-Help/Support Groups Organization         Address  Phone             Notes  [place]. of [place] - variety of support groups  [place] Call for more information  Narcotics Anonymous (NA), [facility] [address], [employer] [place]  2 meetings at this location   Special educational needs teacher         Address  Phone  Notes  [facility] Residential Treatment [place],    [place]  [phone]   [facility]  [address] [place] [phone], [place], [place]   [place], [place] [phone] Admissions: 8am-3pm M-F  Incentives Substance [place] [address],    [place], [place] [phone]   [facility] [address] [place], [place], [place]   [facility] [address]  [place], [place]   [facility] Programs - Intensive Outpatient [address], [place], [place]   [facility] [place]  [place], [place] [phone] or [phone]   [facility] Services (RTS) [address], [place], [place] Accepts Medicaid  [facility] [place] [address]  [place] [phone] Substance Abuse/Addiction Treatment   [facility] Organization         Address  Phone  Notes  [facility]  [phone]   Domenic Schwab, PhD [address] Arlis Porta [place], [place]   [phone] or [phone]   [place]  [place], [place] [phone]   [facility] [address] [place], [place] [phone] Insurance/Medicaid/sponsorship through [facility] and Families [address], [address] [place]                                    [place], [place] [phone] [place] [address], [place] [phone]    Dr. Adele Schilder  [phone]   [facility] Dept. 1) [address] [place] 2) [place] 3)  [place] [address], [place] [phone]  [phone]   [place] [phone] or [phone] (After Hours)

## 2014-03-23 NOTE — ED Notes (Signed)
Pt presents with c/o of right lower abdominal pain. Pt reports palpable mass to RLQ with associated pain x 1 month.   Pt denies N/V/D. Was seen at urgent care and was told this was a muscle sprain.

## 2014-04-01 ENCOUNTER — Ambulatory Visit: Payer: BLUE CROSS/BLUE SHIELD | Attending: Internal Medicine | Admitting: Family Medicine

## 2014-04-01 ENCOUNTER — Encounter: Payer: Self-pay | Admitting: Internal Medicine

## 2014-04-01 VITALS — BP 130/69 | HR 70 | Temp 98.0°F | Resp 16 | Wt 185.6 lb

## 2014-04-01 DIAGNOSIS — Z113 Encounter for screening for infections with a predominantly sexual mode of transmission: Secondary | ICD-10-CM | POA: Diagnosis not present

## 2014-04-01 DIAGNOSIS — N529 Male erectile dysfunction, unspecified: Secondary | ICD-10-CM | POA: Diagnosis not present

## 2014-04-01 DIAGNOSIS — F17200 Nicotine dependence, unspecified, uncomplicated: Secondary | ICD-10-CM | POA: Diagnosis not present

## 2014-04-01 DIAGNOSIS — E7800 Pure hypercholesterolemia, unspecified: Secondary | ICD-10-CM

## 2014-04-01 DIAGNOSIS — D171 Benign lipomatous neoplasm of skin and subcutaneous tissue of trunk: Secondary | ICD-10-CM | POA: Insufficient documentation

## 2014-04-01 DIAGNOSIS — N5201 Erectile dysfunction due to arterial insufficiency: Secondary | ICD-10-CM

## 2014-04-01 DIAGNOSIS — R1900 Intra-abdominal and pelvic swelling, mass and lump, unspecified site: Secondary | ICD-10-CM | POA: Diagnosis not present

## 2014-04-01 DIAGNOSIS — Z720 Tobacco use: Secondary | ICD-10-CM

## 2014-04-01 DIAGNOSIS — M79605 Pain in left leg: Secondary | ICD-10-CM | POA: Insufficient documentation

## 2014-04-01 DIAGNOSIS — Z8781 Personal history of (healed) traumatic fracture: Secondary | ICD-10-CM | POA: Insufficient documentation

## 2014-04-01 HISTORY — DX: Benign lipomatous neoplasm of skin and subcutaneous tissue of trunk: D17.1

## 2014-04-01 LAB — LIPID PANEL
Cholesterol: 190 mg/dL (ref 0–200)
HDL: 49 mg/dL (ref >39)
LDL CALC: 131 mg/dL — AB (ref 0–99)
TRIGLYCERIDES: 51 mg/dL (ref <150)
Total CHOL/HDL Ratio: 3.9 Ratio
VLDL: 10 mg/dL (ref 0–40)

## 2014-04-01 LAB — HIV ANTIBODY (ROUTINE TESTING W REFLEX): HIV: NONREACTIVE

## 2014-04-01 MED ORDER — VARENICLINE TARTRATE 0.5 MG X 11 & 1 MG X 42 PO MISC: ORAL | Status: DC

## 2014-04-01 MED ORDER — VARENICLINE TARTRATE 1 MG PO TABS: 1.0000 mg | ORAL_TABLET | Freq: Two times a day (BID) | ORAL | Status: DC

## 2014-04-01 MED ORDER — NAPROXEN 500 MG PO TABS: 500.0000 mg | ORAL_TABLET | Freq: Two times a day (BID) | ORAL | Status: DC

## 2014-04-01 NOTE — Assessment & Plan Note (Signed)
Smoking: Ready to quit Smoking cessation support: smoking cessation hotline: 1-800-QUIT-NOW.  Smoking cessation classes are available through [facility] and Vascular Center. Call 916-183-7144 or visit our website at [email or web address]/. I recommend chantix  Starting pack Take one 0.5 mg tablet by mouth once daily for 3 days, then increase to one 0.5 mg tablet twice daily for 4 days, then increase to one 1 mg tablet twice daily. Also nicotine replacement like gum or patch for start with 21 mg patch if doing patches, following instructions on the box. I recommend target brand patches, best prices.

## 2014-04-01 NOTE — Progress Notes (Signed)
   Subjective:    Patient ID: Charles Wise, male    DOB: 1971-01-02, 44 y.o.   MRN: [id number] CC; rib pain on R side, trouble maintaining erection, smoker desires to quit  HPI 44 yo M NP:  1. R side pain: started 3 weeks ago following heavy lifting at work. Getting bigger. Worse with standing and lifting. Better with lying down and wearing supportive belt. No N/Vfever/emesis, constipation.   2.  L femur fracture: in 80s. Still gets pain and swelling now and then. No new injury.   3. Smoker: desires to quit. Smoking for 30 years.   4. ED: trouble maintaining erection for past few years. No treatment. Current smoker.   Soc Hx: current smoker  Med Hx: negative Surg Hx: L femur repair following fracture in 1980s  Review of Systems As per HPI    Objective:   Physical Exam BP 130/69 mmHg  Pulse 70  Temp(Src) 98 F (36.7 C)  Resp 16  Wt 185 lb 9.6 oz (84.188 kg)  SpO2 100% General appearance: alert, cooperative and no distress Lungs: clear to auscultation bilaterally Chest wall: no tenderness Heart: regular rate and rhythm, S1, S2 normal, no murmur, click, rub or gallop  Abdomen: normal findings: bowel sounds normal and abnormal findings:  mass, located in the right flank, mildly tender, reducible, exacerbated by standing  Male genitalia: normal Extremities: extremities normal, atraumatic, no cyanosis or edema    Assessment & Plan:

## 2014-04-01 NOTE — Progress Notes (Signed)
Patient here to establish care Complains of having a "KNOT" to his right side And some pain -does a lot of lifting on his job Having some issues with Erectile dysfunction

## 2014-04-01 NOTE — Assessment & Plan Note (Signed)
A: over area of previous fracture P: x-ray

## 2014-04-01 NOTE — Assessment & Plan Note (Signed)
A: suspect spigelian hernia given mechanism of injury, area is reducible P:  Abdominal ultrasound ordered  Pain control with NSAID- naproxen ordered Ice  Likely surgical referral after reviewing ultrasound

## 2014-04-01 NOTE — Assessment & Plan Note (Signed)
3. Erectile dysfunction Will improve with quitting smoking  Read about viagra

## 2014-04-01 NOTE — Assessment & Plan Note (Signed)
Screening HIV  

## 2014-04-01 NOTE — Patient Instructions (Signed)
Mr. Desroches,  Thank you for coming in today. It was a pleasure meeting you. I look forward to being your primary doctor.   1. Abdominal wall hernia:  Please use naproxen  Ice twice daily 20 minutes  Avoid heavy lifting  Ultrasound of abdomen ordered  Likely referral to general surgery   2. Smoking: Ready to quit Smoking cessation support: smoking cessation hotline: 1-800-QUIT-NOW.  Smoking cessation classes are available through [facility] and [facility]. Call [phone] or visit our website at [email or web address] I recommend chantix  Starting pack Take one 0.5 mg tablet by mouth once daily for 3 days, then increase to one 0.5 mg tablet twice daily for 4 days, then increase to one 1 mg tablet twice daily. Also nicotine replacement like gum or patch for start with 21 mg patch if doing patches, following instructions on the box. I recommend target brand patches, best prices.   3. Erectile dysfunction Will improve with quitting smoking  Read about viagra   You will be called with labs and imaging results  F/u in 6 weeks sooner if needed for r abdominal hernia and smoking   Dr. Adrian Blackwater   Sildenafil tablets (Viagra) What is this medicine? SILDENAFIL (sil DEN a fil) is used to treat erection problems in men. This medicine may be used for other purposes; ask your health care provider or pharmacist if you have questions. COMMON BRAND NAME(S): Viagra What should I tell my health care provider before I take this medicine? They need to know if you have any of these conditions: -bleeding disorders -eye or vision problems, including a rare inherited eye disease called retinitis pigmentosa -anatomical deformation of the penis, Peyronie's disease, or history of priapism (painful and prolonged erection) -heart disease, angina, a history of heart attack, irregular heart beats, or other heart problems -high or low blood pressure -history of blood diseases, like sickle cell anemia  or leukemia -history of stomach bleeding -kidney disease -liver disease -stroke -an unusual or allergic reaction to sildenafil, other medicines, foods, dyes, or preservatives -pregnant or trying to get pregnant -breast-feeding How should I use this medicine? Take this medicine by mouth with a glass of water. Follow the directions on the prescription label. The dose is usually taken 1 hour before sexual activity. You should not take the dose more than once per day. Do not take your medicine more often than directed. Talk to your pediatrician regarding the use of this medicine in children. This medicine is not used in children for this condition. Overdosage: If you think you have taken too much of this medicine contact a poison control center or emergency room at once. NOTE: This medicine is only for you. Do not share this medicine with others. What if I miss a dose? This does not apply. Do not take double or extra doses. What may interact with this medicine? Do not take this medicine with any of the following medications: -cisapride -methscopolamine nitrate -nitrates like amyl nitrite, isosorbide dinitrate, isosorbide mononitrate, nitroglycerin -nitroprusside -other medicines for erectile dysfunction like avanafil, tadalafil, vardenafil -other sildenafil products (Revatio) This medicine may also interact with the following medications: -certain drugs for high blood pressure -certain drugs for the treatment of HIV infection or AIDS -certain drugs used for fungal or yeast infections, like fluconazole, itraconazole, ketoconazole, and voriconazole -cimetidine -erythromycin -rifampin This list may not describe all possible interactions. Give your health care provider a list of all the medicines, herbs, non-prescription drugs, or dietary supplements you use. Also tell  them if you smoke, drink alcohol, or use illegal drugs. Some items may interact with your medicine. What should I watch for  while using this medicine? If you notice any changes in your vision while taking this drug, call your doctor or health care professional as soon as possible. Stop using this medicine and call your health care provider right away if you have a loss of sight in one or both eyes. Contact your doctor or health care professional right away if you have an erection that lasts longer than 4 hours or if it becomes painful. This may be a sign of a serious problem and must be treated right away to prevent permanent damage. If you experience symptoms of nausea, dizziness, chest pain or arm pain upon initiation of sexual activity after taking this medicine, you should refrain from further activity and call your doctor or health care professional as soon as possible. Do not drink alcohol to excess (examples, 5 glasses of wine or 5 shots of whiskey) when taking this medicine. When taken in excess, alcohol can increase your chances of getting a headache or getting dizzy, increasing your heart rate or lowering your blood pressure. Using this medicine does not protect you or your partner against HIV infection (the virus that causes AIDS) or other sexually transmitted diseases. What side effects may I notice from receiving this medicine? Side effects that you should report to your doctor or health care professional as soon as possible: -allergic reactions like skin rash, itching or hives, swelling of the face, lips, or tongue -breathing problems -changes in hearing -changes in vision -chest pain -fast, irregular heartbeat -prolonged or painful erection -seizures Side effects that usually do not require medical attention (report to your doctor or health care professional if they continue or are bothersome): -back pain -dizziness -flushing -headache -indigestion -muscle aches -nausea -stuffy or runny nose This list may not describe all possible side effects. Call your doctor for medical advice about side  effects. You may report side effects to FDA at 1-800-FDA-1088. Where should I keep my medicine? Keep out of reach of children. Store at room temperature between 15 and 30 degrees C (59 and 86 degrees F). Throw away any unused medicine after the expiration date. NOTE: This sheet is a summary. It may not cover all possible information. If you have questions about this medicine, talk to your doctor, pharmacist, or health care provider.  2015, Elsevier/Gold Standard. (2012-02-12 12:43:54)

## 2014-04-04 ENCOUNTER — Telehealth: Payer: Self-pay | Admitting: *Deleted

## 2014-04-04 DIAGNOSIS — E7800 Pure hypercholesterolemia, unspecified: Secondary | ICD-10-CM | POA: Insufficient documentation

## 2014-04-04 MED ORDER — ATORVASTATIN CALCIUM 10 MG PO TABS: 10.0000 mg | ORAL_TABLET | Freq: Every day | ORAL | Status: DC

## 2014-04-04 NOTE — Telephone Encounter (Signed)
-----   Message from Minerva Ends, MD sent at 04/04/2014  9:09 AM EST ----- Mildly elevated bad cholesterol, given his smoking status, treatment is recommended to lower heart disease and stroke risk. lipitor 10 mg daily sent to pharmacy.

## 2014-04-04 NOTE — Addendum Note (Signed)
Addended by: Boykin Nearing on: 04/04/2014 09:11 AM   Modules accepted: Orders

## 2014-04-04 NOTE — Telephone Encounter (Signed)
LVM to return call, Rx at [place] [place].

## 2014-04-04 NOTE — Assessment & Plan Note (Signed)
Mildly elevated bad cholesterol, given his smoking status, treatment is recommended to lower heart disease and stroke risk. lipitor 10 mg daily sent to pharmacy.

## 2014-04-06 ENCOUNTER — Telehealth: Payer: Self-pay | Admitting: *Deleted

## 2014-04-06 ENCOUNTER — Telehealth: Payer: Self-pay | Admitting: Family Medicine

## 2014-04-06 NOTE — Telephone Encounter (Signed)
Pt called wanting to speak to nurse please f/u with pt

## 2014-04-06 NOTE — Telephone Encounter (Signed)
Pt aware of lab results Had question about next appointment.  Notified appointment was with GI for [place]      Notes Recorded by Minerva Ends, MD on 04/04/2014 at 9:09 AM Mildly elevated bad cholesterol, given his smoking status, treatment is recommended to lower heart disease and stroke risk. lipitor 10 mg daily sent to pharmacy.

## 2014-04-08 ENCOUNTER — Telehealth: Payer: Self-pay | Admitting: Family Medicine

## 2014-04-08 ENCOUNTER — Telehealth: Payer: Self-pay | Admitting: *Deleted

## 2014-04-08 NOTE — Telephone Encounter (Signed)
Pt want to be fax letter to employer Fax [phone] ATT [employer]   Mail to Pt home address.

## 2014-04-08 NOTE — Telephone Encounter (Signed)
Pt requesting a letter for his employer explaining procedure and Dx.

## 2014-04-08 NOTE — Telephone Encounter (Signed)
Patient called to request a letter for his employer stating that he has a procedure coming up for an ultrasound and that he might have to have surgery. Please f/u with pt.

## 2014-04-10 NOTE — Telephone Encounter (Signed)
Letter written, signed and placed up front for pick up.

## 2014-04-11 ENCOUNTER — Ambulatory Visit
Admission: RE | Admit: 2014-04-11 | Discharge: 2014-04-11 | Disposition: A | Discharge status: Home / Self Care | Payer: BLUE CROSS/BLUE SHIELD | Source: Ambulatory Visit | Attending: Family Medicine | Admitting: Family Medicine

## 2014-04-11 DIAGNOSIS — R1900 Intra-abdominal and pelvic swelling, mass and lump, unspecified site: Secondary | ICD-10-CM

## 2014-04-12 ENCOUNTER — Ambulatory Visit
Admission: RE | Admit: 2014-04-12 | Discharge: 2014-04-12 | Disposition: A | Discharge status: Home / Self Care | Payer: BLUE CROSS/BLUE SHIELD | Source: Ambulatory Visit | Attending: Family Medicine | Admitting: Family Medicine

## 2014-04-12 ENCOUNTER — Encounter: Payer: Self-pay | Admitting: Family Medicine

## 2014-04-12 ENCOUNTER — Encounter: Payer: Self-pay | Admitting: Emergency Medicine

## 2014-04-12 ENCOUNTER — Other Ambulatory Visit: Payer: Self-pay | Admitting: Family Medicine

## 2014-04-12 DIAGNOSIS — M79605 Pain in left leg: Secondary | ICD-10-CM

## 2014-04-12 DIAGNOSIS — R1900 Intra-abdominal and pelvic swelling, mass and lump, unspecified site: Secondary | ICD-10-CM

## 2014-04-12 NOTE — Telephone Encounter (Signed)
Pt aware of [place] and X-ray results Notified letter in front office

## 2014-08-22 ENCOUNTER — Ambulatory Visit: Payer: BLUE CROSS/BLUE SHIELD | Attending: Family Medicine | Admitting: Family Medicine

## 2014-08-22 ENCOUNTER — Encounter: Payer: Self-pay | Admitting: Family Medicine

## 2014-08-22 VITALS — BP 107/64 | HR 65 | Temp 98.7°F | Resp 18 | Ht 69.0 in | Wt 188.0 lb

## 2014-08-22 DIAGNOSIS — E7800 Pure hypercholesterolemia, unspecified: Secondary | ICD-10-CM

## 2014-08-22 DIAGNOSIS — Z720 Tobacco use: Secondary | ICD-10-CM | POA: Diagnosis not present

## 2014-08-22 DIAGNOSIS — R1900 Intra-abdominal and pelvic swelling, mass and lump, unspecified site: Secondary | ICD-10-CM

## 2014-08-22 DIAGNOSIS — F17200 Nicotine dependence, unspecified, uncomplicated: Secondary | ICD-10-CM

## 2014-08-22 DIAGNOSIS — D171 Benign lipomatous neoplasm of skin and subcutaneous tissue of trunk: Secondary | ICD-10-CM | POA: Diagnosis not present

## 2014-08-22 DIAGNOSIS — N5201 Erectile dysfunction due to arterial insufficiency: Secondary | ICD-10-CM | POA: Diagnosis not present

## 2014-08-22 MED ORDER — SILDENAFIL CITRATE 100 MG PO TABS: 50.0000 mg | ORAL_TABLET | Freq: Every day | ORAL | Status: DC | PRN

## 2014-08-22 MED ORDER — ATORVASTATIN CALCIUM 10 MG PO TABS: 10.0000 mg | ORAL_TABLET | Freq: Every day | ORAL | Status: DC

## 2014-08-22 MED ORDER — NAPROXEN 500 MG PO TABS: 500.0000 mg | ORAL_TABLET | Freq: Two times a day (BID) | ORAL | Status: DC

## 2014-08-22 NOTE — Assessment & Plan Note (Signed)
A; abdominal wall lipoma P: general surgery referral placed

## 2014-08-22 NOTE — Patient Instructions (Addendum)
Charles Wise   1. Lipoma right lower abdominal wall: Naproxen for pain General surgery referral  2. Erectile dysfunction: Relax Keep communication open Viagra 50-100 mg  3. Smoking cessation: Smoking cessation support: smoking cessation hotline: 1-800-QUIT-NOW.  Smoking cessation classes are available through [facility] and [facility]. Call [phone] or visit our website at [email or web address]  Fu in 12 weeks for smoking   Dr. Adrian Blackwater

## 2014-08-22 NOTE — Progress Notes (Signed)
F/U abdominal mass  Need referral to [facility]

## 2014-08-23 NOTE — Progress Notes (Signed)
   Subjective:    Patient ID: Charles Wise, male    DOB: 1970-03-18, 44 y.o.   MRN: [id number] CC: abdominal lipoma, erectile dysfunction  HPI  1. Lipoma: R abdomen. Confirmed on [place]. Enlarging. There is associated pain esp when he wears his wide supportive belt at work.   2. ED: x 10 years. No improvement with cutting down smoking. Previously masturbated a lot. Now has a partner and has trouble getting and maintaining an erection.   3. Smoking: has cut down with chantix. Still smoking. Would like to quit. Denies chronic cough. Denies SOB.    Soc Hx: current smoker   Review of Systems  Constitutional: Negative for fever, chills, fatigue and unexpected weight change.  Eyes: Negative for visual disturbance.  Respiratory: Negative for cough and shortness of breath.   Cardiovascular: Negative for chest pain, palpitations and leg swelling.  Gastrointestinal: Positive for abdominal pain. Negative for nausea, vomiting, diarrhea, constipation and blood in stool.  Musculoskeletal: Negative for myalgias, back pain, arthralgias, gait problem and neck pain.  Skin: Negative for rash.       Objective:   Physical Exam BP 107/64 mmHg  Pulse 65  Temp(Src) 98.7 F (37.1 C) (Oral)  Resp 18  Ht 5\' 9"  (1.753 m)  Wt 188 lb (85.276 kg)  BMI 27.75 kg/m2  SpO2 98% General appearance: alert, cooperative and no distress Lungs: normal WOB Abdomen: 8 cm x 7 cm slightly tender subcutaneous mass R middle abdomen  Extremities: extremities normal, atraumatic, no cyanosis or edema     Assessment & Plan:

## 2014-08-23 NOTE — Assessment & Plan Note (Signed)
.   Lipoma right lower abdominal wall: Naproxen for pain General surgery referral  2. Erectile dysfunction: Relax Keep communication open Viagra 50-100 mg

## 2014-08-23 NOTE — Assessment & Plan Note (Signed)
A: current smoker, cutting back with chantix 0.5 mg BID  P: Increase chantix to 1 mg BID Quit  Smoking cessation support: smoking cessation hotline: 1-800-QUIT-NOW.  Smoking cessation classes are available through [facility] and [facility]. Call [phone] or visit our website at [email or web address]

## 2014-08-25 ENCOUNTER — Telehealth: Payer: Self-pay | Admitting: Family Medicine

## 2014-08-25 NOTE — Telephone Encounter (Signed)
I don't see a surgery referral

## 2014-08-25 NOTE — Telephone Encounter (Signed)
Pt said pcp referred to [facility] [place] [facility] for a mass removal. Pt is wanting to know the status of referral or if he needs to call to make appt. Please follow up with pt.

## 2014-08-26 DIAGNOSIS — N19 Unspecified kidney failure: Secondary | ICD-10-CM

## 2014-08-26 HISTORY — DX: Unspecified kidney failure: N19

## 2014-08-26 NOTE — Addendum Note (Signed)
Addended by: Boykin Nearing on: 08/26/2014 10:36 AM   Modules accepted: Orders

## 2014-08-30 ENCOUNTER — Telehealth: Payer: Self-pay | Admitting: Family Medicine

## 2014-08-30 NOTE — Telephone Encounter (Signed)
Referral place by Dr Adrian Blackwater

## 2014-08-30 NOTE — Telephone Encounter (Signed)
Pt is wanting to check on the status of his referral to [facility] [place] [facility]. Please follow up with pt. Thank you.

## 2014-08-30 NOTE — Telephone Encounter (Signed)
I spoke to patient and he is aware of his referral be sent to [facility]

## 2014-09-07 ENCOUNTER — Emergency Department (HOSPITAL_COMMUNITY): Payer: BLUE CROSS/BLUE SHIELD

## 2014-09-07 ENCOUNTER — Inpatient Hospital Stay (HOSPITAL_COMMUNITY)
Admission: EM | Admit: 2014-09-07 | Discharge: 2014-09-08 | DRG: 683 | Disposition: A | Discharge status: Home / Self Care | Payer: BLUE CROSS/BLUE SHIELD | Attending: Internal Medicine | Admitting: Internal Medicine

## 2014-09-07 ENCOUNTER — Encounter (HOSPITAL_COMMUNITY): Payer: Self-pay | Admitting: Emergency Medicine

## 2014-09-07 DIAGNOSIS — E780 Pure hypercholesterolemia: Secondary | ICD-10-CM | POA: Diagnosis present

## 2014-09-07 DIAGNOSIS — F17210 Nicotine dependence, cigarettes, uncomplicated: Secondary | ICD-10-CM | POA: Diagnosis present

## 2014-09-07 DIAGNOSIS — N179 Acute kidney failure, unspecified: Principal | ICD-10-CM | POA: Diagnosis present

## 2014-09-07 DIAGNOSIS — T675XXA Heat exhaustion, unspecified, initial encounter: Secondary | ICD-10-CM | POA: Diagnosis not present

## 2014-09-07 DIAGNOSIS — R079 Chest pain, unspecified: Secondary | ICD-10-CM | POA: Diagnosis present

## 2014-09-07 DIAGNOSIS — M6282 Rhabdomyolysis: Secondary | ICD-10-CM | POA: Diagnosis present

## 2014-09-07 DIAGNOSIS — E860 Dehydration: Secondary | ICD-10-CM | POA: Diagnosis present

## 2014-09-07 HISTORY — DX: Pure hypercholesterolemia, unspecified: E78.00

## 2014-09-07 HISTORY — DX: Acute kidney failure, unspecified: N17.9

## 2014-09-07 LAB — URINALYSIS, ROUTINE W REFLEX MICROSCOPIC
GLUCOSE, UA: NEGATIVE mg/dL
KETONES UR: 15 mg/dL — AB
LEUKOCYTES UA: NEGATIVE
NITRITE: NEGATIVE
PH: 5 (ref 5.0–8.0)
PROTEIN: 100 mg/dL — AB
Specific Gravity, Urine: 1.023 (ref 1.005–1.030)
Urobilinogen, UA: 0.2 mg/dL (ref 0.0–1.0)

## 2014-09-07 LAB — I-STAT CHEM 8, ED
BUN: 36 mg/dL — AB (ref 6–20)
Calcium, Ion: 1.05 mmol/L — ABNORMAL LOW (ref 1.12–1.23)
Chloride: 101 mmol/L (ref 101–111)
Creatinine, Ser: 2.6 mg/dL — ABNORMAL HIGH (ref 0.61–1.24)
Glucose, Bld: 111 mg/dL — ABNORMAL HIGH (ref 65–99)
HEMATOCRIT: 44 % (ref 39.0–52.0)
Hemoglobin: 15 g/dL (ref 13.0–17.0)
Potassium: 4.8 mmol/L (ref 3.5–5.1)
SODIUM: 133 mmol/L — AB (ref 135–145)
TCO2: 19 mmol/L (ref 0–100)

## 2014-09-07 LAB — CBC
HCT: 48.9 % (ref 39.0–52.0)
Hemoglobin: 17.3 g/dL — ABNORMAL HIGH (ref 13.0–17.0)
MCH: 31 pg (ref 26.0–34.0)
MCHC: 35.4 g/dL (ref 30.0–36.0)
MCV: 87.6 fL (ref 78.0–100.0)
Platelets: 225 10*3/uL (ref 150–400)
RBC: 5.58 MIL/uL (ref 4.22–5.81)
RDW: 14.2 % (ref 11.5–15.5)
WBC: 14.4 10*3/uL — ABNORMAL HIGH (ref 4.0–10.5)

## 2014-09-07 LAB — COMPREHENSIVE METABOLIC PANEL
ALBUMIN: 6 g/dL — AB (ref 3.5–5.0)
ALT: 17 U/L (ref 17–63)
ANION GAP: 23 — AB (ref 5–15)
AST: 31 U/L (ref 15–41)
Alkaline Phosphatase: 121 U/L (ref 38–126)
BILIRUBIN TOTAL: 1.3 mg/dL — AB (ref 0.3–1.2)
BUN: 38 mg/dL — ABNORMAL HIGH (ref 6–20)
CO2: 19 mmol/L — ABNORMAL LOW (ref 22–32)
Calcium: 10.3 mg/dL (ref 8.9–10.3)
Chloride: 89 mmol/L — ABNORMAL LOW (ref 101–111)
Creatinine, Ser: 3.84 mg/dL — ABNORMAL HIGH (ref 0.61–1.24)
GFR calc Af Amer: 21 mL/min — ABNORMAL LOW (ref >60)
GFR calc non Af Amer: 18 mL/min — ABNORMAL LOW (ref >60)
Glucose, Bld: 104 mg/dL — ABNORMAL HIGH (ref 65–99)
Potassium: 5.9 mmol/L — ABNORMAL HIGH (ref 3.5–5.1)
Sodium: 131 mmol/L — ABNORMAL LOW (ref 135–145)
Total Protein: 10 g/dL — ABNORMAL HIGH (ref 6.5–8.1)

## 2014-09-07 LAB — I-STAT TROPONIN, ED: TROPONIN I, POC: 0.04 ng/mL (ref 0.00–0.08)

## 2014-09-07 LAB — LIPASE, BLOOD: Lipase: 21 U/L — ABNORMAL LOW (ref 22–51)

## 2014-09-07 LAB — URINE MICROSCOPIC-ADD ON

## 2014-09-07 LAB — I-STAT CG4 LACTIC ACID, ED: LACTIC ACID, VENOUS: 0.85 mmol/L (ref 0.5–2.0)

## 2014-09-07 LAB — I-STAT ARTERIAL BLOOD GAS, ED
ACID-BASE DEFICIT: 5 mmol/L — AB (ref 0.0–2.0)
BICARBONATE: 19.7 meq/L — AB (ref 20.0–24.0)
O2 SAT: 96 %
PCO2 ART: 34.2 mmHg — AB (ref 35.0–45.0)
PH ART: 7.369 (ref 7.350–7.450)
TCO2: 21 mmol/L (ref 0–100)
pO2, Arterial: 85 mmHg (ref 80.0–100.0)

## 2014-09-07 LAB — CK: Total CK: 482 U/L — ABNORMAL HIGH (ref 49–397)

## 2014-09-07 MED ORDER — SODIUM CHLORIDE 0.9 % IV BOLUS (SEPSIS)
2000.0000 mL | Freq: Once | INTRAVENOUS | Status: AC
  Administered 2014-09-07: 2000 mL via INTRAVENOUS

## 2014-09-07 MED ORDER — SODIUM CHLORIDE 0.9 % IV SOLN
Freq: Once | INTRAVENOUS | Status: AC
  Administered 2014-09-07: 23:00:00 via INTRAVENOUS

## 2014-09-07 MED ORDER — SODIUM CHLORIDE 0.9 % IV BOLUS (SEPSIS)
1000.0000 mL | Freq: Once | INTRAVENOUS | Status: AC
  Administered 2014-09-07: 1000 mL via INTRAVENOUS

## 2014-09-07 NOTE — ED Notes (Signed)
Family at bedside. 

## 2014-09-07 NOTE — ED Notes (Signed)
C/o cramps all over body all day.  Reports he currently has cramping pain to L chest that radiates to back.  Also reports sob, nausea, and vomited x 1.

## 2014-09-07 NOTE — ED Notes (Signed)
Patient is resting comfortably. 
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
## 2014-09-08 ENCOUNTER — Inpatient Hospital Stay (HOSPITAL_COMMUNITY): Payer: BLUE CROSS/BLUE SHIELD

## 2014-09-08 DIAGNOSIS — N179 Acute kidney failure, unspecified: Principal | ICD-10-CM

## 2014-09-08 DIAGNOSIS — R079 Chest pain, unspecified: Secondary | ICD-10-CM

## 2014-09-08 LAB — PHOSPHORUS: Phosphorus: 3 mg/dL (ref 2.5–4.6)

## 2014-09-08 LAB — COMPREHENSIVE METABOLIC PANEL
ALT: 12 U/L — ABNORMAL LOW (ref 17–63)
ANION GAP: 9 (ref 5–15)
AST: 22 U/L (ref 15–41)
Albumin: 3.6 g/dL (ref 3.5–5.0)
Alkaline Phosphatase: 81 U/L (ref 38–126)
BUN: 28 mg/dL — ABNORMAL HIGH (ref 6–20)
CALCIUM: 8.3 mg/dL — AB (ref 8.9–10.3)
CHLORIDE: 104 mmol/L (ref 101–111)
CO2: 21 mmol/L — ABNORMAL LOW (ref 22–32)
CREATININE: 1.59 mg/dL — AB (ref 0.61–1.24)
GFR calc Af Amer: >60 mL/min (ref >60)
GFR calc non Af Amer: 52 mL/min — ABNORMAL LOW (ref >60)
GLUCOSE: 94 mg/dL (ref 65–99)
POTASSIUM: 3.9 mmol/L (ref 3.5–5.1)
Sodium: 134 mmol/L — ABNORMAL LOW (ref 135–145)
TOTAL PROTEIN: 6.2 g/dL — AB (ref 6.5–8.1)
Total Bilirubin: 0.7 mg/dL (ref 0.3–1.2)

## 2014-09-08 LAB — CBC
HEMATOCRIT: 37.1 % — AB (ref 39.0–52.0)
HEMOGLOBIN: 12.6 g/dL — AB (ref 13.0–17.0)
MCH: 30.1 pg (ref 26.0–34.0)
MCHC: 34 g/dL (ref 30.0–36.0)
MCV: 88.5 fL (ref 78.0–100.0)
PLATELETS: 222 10*3/uL (ref 150–400)
RBC: 4.19 MIL/uL — ABNORMAL LOW (ref 4.22–5.81)
RDW: 14.6 % (ref 11.5–15.5)
WBC: 10 10*3/uL (ref 4.0–10.5)

## 2014-09-08 LAB — MAGNESIUM: MAGNESIUM: 2.2 mg/dL (ref 1.7–2.4)

## 2014-09-08 LAB — TROPONIN I

## 2014-09-08 LAB — SODIUM, URINE, RANDOM: SODIUM UR: 47 mmol/L

## 2014-09-08 LAB — CREATININE, URINE, RANDOM: Creatinine, Urine: 103.62 mg/dL

## 2014-09-08 LAB — TSH: TSH: 0.5 u[IU]/mL (ref 0.350–4.500)

## 2014-09-08 MED ORDER — SODIUM CHLORIDE 0.9 % IJ SOLN
3.0000 mL | Freq: Two times a day (BID) | INTRAMUSCULAR | Status: DC
  Administered 2014-09-08: 3 mL via INTRAVENOUS

## 2014-09-08 MED ORDER — ONDANSETRON HCL 4 MG/2ML IJ SOLN: 4.0000 mg | Freq: Four times a day (QID) | INTRAMUSCULAR | Status: DC | PRN

## 2014-09-08 MED ORDER — ASPIRIN EC 81 MG PO TBEC
81.0000 mg | DELAYED_RELEASE_TABLET | Freq: Every day | ORAL | Status: DC
  Administered 2014-09-08: 81 mg via ORAL
  Filled 2014-09-08: qty 1

## 2014-09-08 MED ORDER — SODIUM CHLORIDE 0.9 % IV SOLN
INTRAVENOUS | Status: AC
Start: 2014-09-08 — End: 2014-09-08
  Administered 2014-09-08: 02:00:00 via INTRAVENOUS

## 2014-09-08 MED ORDER — HYDROCODONE-ACETAMINOPHEN 5-325 MG PO TABS: 1.0000 | ORAL_TABLET | ORAL | Status: DC | PRN

## 2014-09-08 MED ORDER — ONDANSETRON HCL 4 MG PO TABS: 4.0000 mg | ORAL_TABLET | Freq: Four times a day (QID) | ORAL | Status: DC | PRN

## 2014-09-08 MED ORDER — ENOXAPARIN SODIUM 40 MG/0.4ML ~~LOC~~ SOLN
40.0000 mg | Freq: Every day | SUBCUTANEOUS | Status: DC
  Administered 2014-09-08: 40 mg via SUBCUTANEOUS
  Filled 2014-09-08: qty 0.4

## 2014-09-08 MED ORDER — ATORVASTATIN CALCIUM 10 MG PO TABS
10.0000 mg | ORAL_TABLET | Freq: Every day | ORAL | Status: DC
  Administered 2014-09-08: 10 mg via ORAL
  Filled 2014-09-08: qty 1

## 2014-09-08 MED ORDER — ACETAMINOPHEN 650 MG RE SUPP: 650.0000 mg | Freq: Four times a day (QID) | RECTAL | Status: DC | PRN

## 2014-09-08 MED ORDER — ACETAMINOPHEN 325 MG PO TABS
650.0000 mg | ORAL_TABLET | Freq: Four times a day (QID) | ORAL | Status: DC | PRN
Start: 2014-09-08 — End: 2014-09-08

## 2014-09-08 NOTE — Discharge Summary (Signed)
Physician Discharge Summary  Charles Wise BDZ:[id number] DOB: 1970-03-01 DOA: 09/07/2014  PCP: Minerva Ends, MD  Admit date: 09/07/2014 Discharge date: 09/08/2014  Time spent: 45 minutes  Recommendations for Outpatient Follow-up:  -Will be discharged home today. -Advised to follow-up with primary care provider in 3 days for renal function check. -Please note that his statin has been discontinued given rhabdomyolysis risk.   Discharge Diagnoses:  Active Problems:   Acute renal failure   Chest pain   Heat exhaustion   Dehydration   Discharge Condition: Stable and improved   History of present illness:  Charles Wise is a 44 y.o. male   has a past medical history of High cholesterol.   Presented with nausea, vomiting body cramping in extremities and torso after overheating at work. Patient works on the [employer] information [employer]. Denies any shortness of breath patient. He was trying to drink water but could not keep up. He was having cramps and sharp pains. Presented to emergency department was noted to have creatinine up to 3.84. Patient was given IV fluids and repeat creatinine showed 2.60 ABG was obtained showing pH of 7.369 PCO2 34.2 PO2 85 CK 400 initial potassium was up to 5.9 after repeat was 4.8 initial benign gap elevated 23  Hospitalist was called for admission for dehydration and acute renal failure  Hospital Course:   Acute renal failure -Secondary to dehydration and mild rhabdomyolysis. -Creatinine on admission was 3.35 which has trended down to 1.5 with IV fluids. -Patient is not having any nausea or vomiting and I have no reason to believe that he will not be able to ingest copious amounts of oral fluids over the next few days. Because of this and because of patient's insistence, will elect to discharge him home today. Have recommended close follow-up with his outpatient provider in 3 weeks to recheck his renal function. -Statin has been discontinued as  well given his heat exhaustion mildly elevated CPK and risk for rhabdomyolysis.  Procedures:  None   Consultations:  None  Discharge Instructions  Discharge Instructions    Increase activity slowly    Complete by:  As directed             Medication List    STOP taking these medications        atorvastatin 10 MG tablet  Commonly known as:  LIPITOR     naproxen 500 MG tablet  Commonly known as:  NAPROSYN      TAKE these medications        CENTRUM ADULTS Tabs  Take 1 tablet by mouth daily.     sildenafil 100 MG tablet  Commonly known as:  VIAGRA  Take 0.5-1 tablets (50-100 mg total) by mouth daily as needed for erectile dysfunction.       No Known Allergies     Follow-up Information    Follow up with Minerva Ends, MD. Schedule an appointment as soon as possible for a visit in 3 days.   Specialty:  Family Medicine   Contact information:   [place] [address] [phone]        The results of significant diagnostics from this hospitalization (including imaging, microbiology, ancillary and laboratory) are listed below for reference.    Significant Diagnostic Studies: Dg Chest 2 View  09/07/2014   CLINICAL DATA:  44 year old male with body cramping, shortness of breath and emesis  EXAM: CHEST  2 VIEW  COMPARISON:  None.  FINDINGS: The lungs are clear and negative for focal airspace consolidation, pulmonary edema or suspicious pulmonary nodule. No pleural effusion or pneumothorax. Cardiac and mediastinal contours are within normal limits. No acute fracture or lytic or blastic osseous lesions. The visualized upper abdominal bowel gas pattern is unremarkable.  IMPRESSION: Negative chest x-ray.   Electronically Signed   By: Jacqulynn Cadet M.D.   On: 09/07/2014 17:21    Microbiology: No results found for this or any previous visit (from the past 240 hour(s)).   Labs: Basic Metabolic Panel:  Recent Labs Lab 09/07/14 1816  09/07/14 2056 09/08/14 0502  NA 131* 133* 134*  K 5.9* 4.8 3.9  CL 89* 101 104  CO2 19*  --  21*  GLUCOSE 104* 111* 94  BUN 38* 36* 28*  CREATININE 3.84* 2.60* 1.59*  CALCIUM 10.3  --  8.3*  MG  --   --  2.2  PHOS  --   --  3.0   Liver Function Tests:  Recent Labs Lab 09/07/14 1816 09/08/14 0502  AST 31 22  ALT 17 12*  ALKPHOS 121 81  BILITOT 1.3* 0.7  PROT 10.0* 6.2*  ALBUMIN 6.0* 3.6    Recent Labs Lab 09/07/14 1816  LIPASE 21*   No results for input(s): AMMONIA in the last 168 hours. CBC:  Recent Labs Lab 09/07/14 1825 09/07/14 2056 09/08/14 0502  WBC 14.4*  --  10.0  HGB 17.3* 15.0 12.6*  HCT 48.9 44.0 37.1*  MCV 87.6  --  88.5  PLT 225  --  222   Cardiac Enzymes:  Recent Labs Lab 09/07/14 1816 09/08/14 0502 09/08/14 1030  CKTOTAL 482*  --   --   TROPONINI  --  <0.03 <0.03   BNP: BNP (last 3 results) No results for input(s): BNP in the last 8760 hours.  ProBNP (last 3 results) No results for input(s): PROBNP in the last 8760 hours.  CBG: No results for input(s): GLUCAP in the last 168 hours.     SignedLelon Frohlich  [facility] Pager: [phone] 09/08/2014, 1:40 PM

## 2014-09-08 NOTE — Discharge Instructions (Signed)
Drink a lot of fluids for the next few days!!

## 2014-09-08 NOTE — Progress Notes (Signed)
  Echocardiogram 2D Echocardiogram has been performed.  Diamond Nickel 09/08/2014, 12:55 PM

## 2014-09-16 ENCOUNTER — Ambulatory Visit: Payer: Self-pay | Admitting: Surgery

## 2014-09-16 NOTE — H&P (Signed)
History of Present Illness Charles Wise. Charles Fletchall MD; 09/16/2014 11:13 AM) Patient words: lipoma.  The patient is a 44 year old male who presents with a complaint of Mass. Patient referred by Charles Nearing, MD for evaluation of right lateral abdominal wall mass. This is a 44 year old male who previously had a right lower quadrant intramuscular lipoma excised in 2007 by Dr. Milana Wise. The patient developed a no other mass or laterally last year. This has become fairly large. 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 He presents now to discuss excision. Other Problems (Charles Eversole, LPN; 5/63/1497 0:26 AM) Back Pain Hypercholesterolemia Other disease, cancer, significant illness  Past Surgical History (Charles Eversole, LPN; 3/78/5885 0:27 AM) Knee Surgery Left. Resection of Stomach  Diagnostic Studies History (Charles Eversole, LPN; 7/41/2878 6:76 AM) Colonoscopy never  Allergies (Charles Eversole, LPN; 09/13/9468 9:62 AM) No Known Drug Allergies 09/16/2014  Medication History (Charles Eversole, LPN; 8/36/6294 7:65 AM) Atorvastatin Calcium (10MG  Tablet, Oral) Active. Multiple Vitamin (Oral) Active. Medications Reconciled  Social History (Charles Eversole, LPN; 4/65/0354 6:56 AM) Alcohol use Occasional alcohol use. Caffeine use Carbonated beverages, Coffee, Tea. Illicit drug use Remotely quit drug use. Tobacco use Current every day smoker.  Family History (Charles Eversole, LPN; 10/07/7515 0:01 AM) Alcohol Abuse Brother, Father, Mother. Cervical Cancer Family Members In General. Depression Brother. Hypertension Father.     Review of Systems (Charles Eversole LPN; 7/49/4496 7:59 AM) General Present- Weight Gain. Not Present- Appetite Loss, Chills, Fatigue, Fever, Night Sweats and Weight Loss. Skin Present- Dryness. Not Present- Change in Wart/Mole, Hives, Jaundice, New Lesions, Non-Healing  Wounds, Rash and Ulcer. HEENT Present- Seasonal Allergies. Not Present- Earache, Hearing Loss, Hoarseness, Nose Bleed, Oral Ulcers, Ringing in the Ears, Sinus Pain, Sore Throat, Visual Disturbances, Wears glasses/contact lenses and Yellow Eyes. Respiratory Present- Difficulty Breathing. Not Present- Bloody sputum, Chronic Cough, Snoring and Wheezing. Breast Not Present- Breast Mass, Breast Pain, Nipple Discharge and Skin Changes. Cardiovascular Not Present- Chest Pain, Difficulty Breathing Lying Down, Leg Cramps, Palpitations, Rapid Heart Rate, Shortness of Breath and Swelling of Extremities. Gastrointestinal Present- Abdominal Pain. Not Present- Bloating, Bloody Stool, Change in Bowel Habits, Chronic diarrhea, Constipation, Difficulty Swallowing, Excessive gas, Gets full quickly at meals, Hemorrhoids, Indigestion, Nausea, Rectal Pain and Vomiting. Male Genitourinary Present- Frequency and Nocturia. Not Present- Blood in Urine, Change in Urinary Stream, Impotence, Painful Urination, Urgency and Urine Leakage.  Vitals (Charles Eversole LPN; 1/63/8466 5:99 AM) 09/16/2014 9:10 AM Weight: 185.6 lb Height: 69in Body Surface Area: 2.02 m Body Mass Index: 27.41 kg/m Temp.: 98.58F(Oral)  Pulse: 80 (Regular)  BP: 110/80 (Sitting, Left Arm, Standard)     Physical Exam Charles Wise Franks MD; 09/16/2014 11:14 AM)  The physical exam findings are as follows: Note:WDWN in NAD HEENT: EOMI, sclera anicteric Neck: No masses, no thyromegaly Lungs: CTA bilaterally; normal respiratory effort CV: Regular rate and rhythm; no murmurs Abd: +bowel sounds, soft, non-tender, Right lateral abdominal wall above iliac crest - 5 cm subcutaneous mass; smooth; mobile Ext: Well-perfused; no edema Skin: Warm, dry; no sign of jaundice    Assessment & Plan Charles Key K. 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 The surgical procedure has been discussed with the patient. Potential risks, benefits, alternative treatments, and expected outcomes have been explained. All of the patient's questions at this  time have been answered. The likelihood of reaching the patient's treatment goal is good. The patient understand the proposed surgical procedure and wishes to proceed.   Charles Wise. Charles Dover, MD, [facility]  General/ Trauma Surgery  09/16/2014 11:14 AM

## 2016-02-02 ENCOUNTER — Ambulatory Visit: Payer: Self-pay | Admitting: Surgery

## 2016-02-12 ENCOUNTER — Encounter (HOSPITAL_COMMUNITY)
Admission: RE | Admit: 2016-02-12 | Discharge: 2016-02-12 | Disposition: A | Discharge status: Home / Self Care | Payer: No Typology Code available for payment source | Source: Ambulatory Visit | Attending: Surgery | Admitting: Surgery

## 2016-02-12 ENCOUNTER — Encounter (HOSPITAL_COMMUNITY): Payer: Self-pay

## 2016-02-12 DIAGNOSIS — Z01812 Encounter for preprocedural laboratory examination: Secondary | ICD-10-CM | POA: Insufficient documentation

## 2016-02-12 DIAGNOSIS — F17210 Nicotine dependence, cigarettes, uncomplicated: Secondary | ICD-10-CM | POA: Diagnosis not present

## 2016-02-12 DIAGNOSIS — E7800 Pure hypercholesterolemia, unspecified: Secondary | ICD-10-CM | POA: Diagnosis not present

## 2016-02-12 DIAGNOSIS — Z8249 Family history of ischemic heart disease and other diseases of the circulatory system: Secondary | ICD-10-CM | POA: Diagnosis not present

## 2016-02-12 DIAGNOSIS — Z6827 Body mass index (BMI) 27.0-27.9, adult: Secondary | ICD-10-CM | POA: Diagnosis not present

## 2016-02-12 DIAGNOSIS — M549 Dorsalgia, unspecified: Secondary | ICD-10-CM | POA: Diagnosis not present

## 2016-02-12 DIAGNOSIS — E669 Obesity, unspecified: Secondary | ICD-10-CM | POA: Diagnosis not present

## 2016-02-12 DIAGNOSIS — D171 Benign lipomatous neoplasm of skin and subcutaneous tissue of trunk: Secondary | ICD-10-CM | POA: Diagnosis not present

## 2016-02-12 HISTORY — DX: Headache, unspecified: R51.9

## 2016-02-12 HISTORY — DX: Headache: R51

## 2016-02-12 HISTORY — DX: Unspecified kidney failure: N19

## 2016-02-12 LAB — BASIC METABOLIC PANEL
ANION GAP: 7 (ref 5–15)
BUN: 9 mg/dL (ref 6–20)
CALCIUM: 9.1 mg/dL (ref 8.9–10.3)
CO2: 26 mmol/L (ref 22–32)
Chloride: 109 mmol/L (ref 101–111)
Creatinine, Ser: 0.93 mg/dL (ref 0.61–1.24)
GLUCOSE: 84 mg/dL (ref 65–99)
POTASSIUM: 4.1 mmol/L (ref 3.5–5.1)
Sodium: 142 mmol/L (ref 135–145)

## 2016-02-12 LAB — CBC
HEMATOCRIT: 39.3 % (ref 39.0–52.0)
Hemoglobin: 13.1 g/dL (ref 13.0–17.0)
MCH: 29.8 pg (ref 26.0–34.0)
MCHC: 33.3 g/dL (ref 30.0–36.0)
MCV: 89.5 fL (ref 78.0–100.0)
PLATELETS: 261 10*3/uL (ref 150–400)
RBC: 4.39 MIL/uL (ref 4.22–5.81)
RDW: 14.3 % (ref 11.5–15.5)
WBC: 8.2 10*3/uL (ref 4.0–10.5)

## 2016-02-12 NOTE — Progress Notes (Signed)
PCP: Dr. Boykin Nearing

## 2016-02-12 NOTE — Pre-Procedure Instructions (Signed)
    Charles Wise  02/12/2016      [id number] [name], [place] [address] [place] [address] Phone: [phone] Fax: [phone]  [facility] [place], [place] - [address] AT [address] [place] [address] Phone: [phone] Fax: [place], [address] [place] [address] Phone: [phone] Fax: [phone]    Your procedure is scheduled on Wed. Dec. 20  Report to [facility] Admitting at 1:00 P.M.  Call this number if you have problems the morning of surgery:  [phone]   Remember:  Do not eat food or drink liquids after midnight on Tues. Dec. 19   Take these medicines the morning of surgery with A SIP OF WATER : none             Stop advil,motrin, ibuprofen, aleve, BC Powders, goody's.   Do not wear jewelry.  Do not wear lotions, powders, or cologne, or deoderant.  Do not shave 48 hours prior to surgery.  Men may shave face and neck.  Do not bring valuables to the hospital.  [facility] is not responsible for any belongings or valuables.  Contacts, dentures or bridgework may not be worn into surgery.  Leave your suitcase in the car.  After surgery it may be brought to your room.  For patients admitted to the hospital, discharge time will be determined by your treatment team.  Patients discharged the day of surgery will not be allowed to drive home.   Name and phone number of your driver:   Special instructions:review preparing for surgery handout  Please read over the following fact sheets that you were given. Coughing and Deep Breathing

## 2016-02-14 ENCOUNTER — Encounter (HOSPITAL_COMMUNITY): Payer: Self-pay | Admitting: *Deleted

## 2016-02-14 ENCOUNTER — Encounter (HOSPITAL_COMMUNITY)
Admission: RE | Disposition: A | Discharge status: Home / Self Care | Payer: Self-pay | Source: Ambulatory Visit | Attending: Surgery

## 2016-02-14 ENCOUNTER — Ambulatory Visit (HOSPITAL_COMMUNITY): Payer: No Typology Code available for payment source | Admitting: Anesthesiology

## 2016-02-14 ENCOUNTER — Ambulatory Visit (HOSPITAL_COMMUNITY)
Admission: RE | Admit: 2016-02-14 | Discharge: 2016-02-14 | Disposition: A | Discharge status: Home / Self Care | Payer: No Typology Code available for payment source | Source: Ambulatory Visit | Attending: Surgery | Admitting: Surgery

## 2016-02-14 DIAGNOSIS — M549 Dorsalgia, unspecified: Secondary | ICD-10-CM | POA: Insufficient documentation

## 2016-02-14 DIAGNOSIS — F17210 Nicotine dependence, cigarettes, uncomplicated: Secondary | ICD-10-CM | POA: Insufficient documentation

## 2016-02-14 DIAGNOSIS — Z6827 Body mass index (BMI) 27.0-27.9, adult: Secondary | ICD-10-CM | POA: Insufficient documentation

## 2016-02-14 DIAGNOSIS — Z8249 Family history of ischemic heart disease and other diseases of the circulatory system: Secondary | ICD-10-CM | POA: Insufficient documentation

## 2016-02-14 DIAGNOSIS — D171 Benign lipomatous neoplasm of skin and subcutaneous tissue of trunk: Secondary | ICD-10-CM | POA: Diagnosis not present

## 2016-02-14 DIAGNOSIS — E7800 Pure hypercholesterolemia, unspecified: Secondary | ICD-10-CM | POA: Insufficient documentation

## 2016-02-14 DIAGNOSIS — E669 Obesity, unspecified: Secondary | ICD-10-CM | POA: Insufficient documentation

## 2016-02-14 HISTORY — PX: LIPOMA EXCISION: SHX5283

## 2016-02-14 SURGERY — EXCISION LIPOMA
Anesthesia: General | Site: Abdomen | Laterality: Right

## 2016-02-14 MED ORDER — PROPOFOL 10 MG/ML IV BOLUS
INTRAVENOUS | Status: AC
  Filled 2016-02-14: qty 20

## 2016-02-14 MED ORDER — FENTANYL CITRATE (PF) 100 MCG/2ML IJ SOLN
INTRAMUSCULAR | Status: DC | PRN
  Administered 2016-02-14 (×2): 50 ug via INTRAVENOUS
  Administered 2016-02-14: 100 ug via INTRAVENOUS

## 2016-02-14 MED ORDER — ROCURONIUM BROMIDE 50 MG/5ML IV SOSY
PREFILLED_SYRINGE | INTRAVENOUS | Status: AC
  Filled 2016-02-14: qty 5

## 2016-02-14 MED ORDER — 0.9 % SODIUM CHLORIDE (POUR BTL) OPTIME
TOPICAL | Status: DC | PRN
  Administered 2016-02-14: 1000 mL

## 2016-02-14 MED ORDER — KETOROLAC TROMETHAMINE 30 MG/ML IJ SOLN
INTRAMUSCULAR | Status: DC | PRN
  Administered 2016-02-14: 30 mg via INTRAVENOUS

## 2016-02-14 MED ORDER — PROPOFOL 10 MG/ML IV BOLUS
INTRAVENOUS | Status: DC | PRN
  Administered 2016-02-14: 200 mg via INTRAVENOUS

## 2016-02-14 MED ORDER — LACTATED RINGERS IV SOLN
INTRAVENOUS | Status: DC
  Administered 2016-02-14: 13:00:00 via INTRAVENOUS

## 2016-02-14 MED ORDER — FENTANYL CITRATE (PF) 100 MCG/2ML IJ SOLN
INTRAMUSCULAR | Status: AC
  Filled 2016-02-14: qty 2

## 2016-02-14 MED ORDER — CEFAZOLIN SODIUM-DEXTROSE 2-4 GM/100ML-% IV SOLN
2.0000 g | INTRAVENOUS | Status: AC
Start: 2016-02-14 — End: 2016-02-14
  Administered 2016-02-14: 2 g via INTRAVENOUS
  Filled 2016-02-14: qty 100

## 2016-02-14 MED ORDER — ONDANSETRON HCL 4 MG/2ML IJ SOLN
INTRAMUSCULAR | Status: DC | PRN
  Administered 2016-02-14: 4 mg via INTRAVENOUS

## 2016-02-14 MED ORDER — KETOROLAC TROMETHAMINE 30 MG/ML IJ SOLN
INTRAMUSCULAR | Status: AC
  Filled 2016-02-14: qty 1

## 2016-02-14 MED ORDER — PROMETHAZINE HCL 25 MG/ML IJ SOLN: 6.2500 mg | INTRAMUSCULAR | Status: DC | PRN

## 2016-02-14 MED ORDER — BUPIVACAINE-EPINEPHRINE 0.25% -1:200000 IJ SOLN
INTRAMUSCULAR | Status: DC | PRN
  Administered 2016-02-14: 20 mL

## 2016-02-14 MED ORDER — OXYCODONE HCL 5 MG PO TABS: 5.0000 mg | ORAL_TABLET | Freq: Four times a day (QID) | ORAL | 0 refills | Status: DC | PRN

## 2016-02-14 MED ORDER — LIDOCAINE 2% (20 MG/ML) 5 ML SYRINGE
INTRAMUSCULAR | Status: AC
  Filled 2016-02-14: qty 5

## 2016-02-14 MED ORDER — MIDAZOLAM HCL 2 MG/2ML IJ SOLN
INTRAMUSCULAR | Status: AC
  Filled 2016-02-14: qty 2

## 2016-02-14 MED ORDER — BUPIVACAINE-EPINEPHRINE (PF) 0.25% -1:200000 IJ SOLN
INTRAMUSCULAR | Status: AC
  Filled 2016-02-14: qty 30

## 2016-02-14 MED ORDER — HYDROMORPHONE HCL 1 MG/ML IJ SOLN: 0.2500 mg | INTRAMUSCULAR | Status: DC | PRN

## 2016-02-14 MED ORDER — CHLORHEXIDINE GLUCONATE CLOTH 2 % EX PADS: 6.0000 | MEDICATED_PAD | Freq: Once | CUTANEOUS | Status: DC

## 2016-02-14 MED ORDER — LIDOCAINE HCL (CARDIAC) 20 MG/ML IV SOLN
INTRAVENOUS | Status: DC | PRN
  Administered 2016-02-14: 100 mg via INTRAVENOUS

## 2016-02-14 MED ORDER — ONDANSETRON HCL 4 MG/2ML IJ SOLN
INTRAMUSCULAR | Status: AC
  Filled 2016-02-14: qty 2

## 2016-02-14 MED ORDER — MIDAZOLAM HCL 2 MG/2ML IJ SOLN
INTRAMUSCULAR | Status: DC | PRN
  Administered 2016-02-14: 2 mg via INTRAVENOUS

## 2016-02-14 SURGICAL SUPPLY — 44 items
APL SKNCLS STERI-STRIP NONHPOA (GAUZE/BANDAGES/DRESSINGS) ×1
BENZOIN TINCTURE PRP APPL 2/3 (GAUZE/BANDAGES/DRESSINGS) ×3 IMPLANT
BLADE SURG 10 STRL SS (BLADE) ×3 IMPLANT
BLADE SURG 15 STRL LF DISP TIS (BLADE) ×1 IMPLANT
BLADE SURG 15 STRL SS (BLADE) ×3
BLADE SURG ROTATE 9660 (MISCELLANEOUS) IMPLANT
CHLORAPREP W/TINT 26ML (MISCELLANEOUS) ×3 IMPLANT
CLOSURE WOUND 1/2 X4 (GAUZE/BANDAGES/DRESSINGS) ×1
COVER SURGICAL LIGHT HANDLE (MISCELLANEOUS) ×3 IMPLANT
DRAPE LAPAROTOMY 100X72 PEDS (DRAPES) ×3 IMPLANT
DRAPE UTILITY XL STRL (DRAPES) ×3 IMPLANT
DRSG TEGADERM 4X4.75 (GAUZE/BANDAGES/DRESSINGS) ×2 IMPLANT
ELECT CAUTERY BLADE 6.4 (BLADE) ×3 IMPLANT
ELECT REM PT RETURN 9FT ADLT (ELECTROSURGICAL) ×3
ELECTRODE REM PT RTRN 9FT ADLT (ELECTROSURGICAL) ×1 IMPLANT
GAUZE SPONGE 4X4 12PLY STRL (GAUZE/BANDAGES/DRESSINGS) ×3 IMPLANT
GLOVE BIO SURGEON STRL SZ7 (GLOVE) ×3 IMPLANT
GLOVE BIOGEL PI IND STRL 7.5 (GLOVE) ×1 IMPLANT
GLOVE BIOGEL PI INDICATOR 7.5 (GLOVE) ×2
GOWN STRL REUS W/ TWL LRG LVL3 (GOWN DISPOSABLE) ×2 IMPLANT
GOWN STRL REUS W/TWL LRG LVL3 (GOWN DISPOSABLE) ×6
KIT BASIN OR (CUSTOM PROCEDURE TRAY) ×3 IMPLANT
KIT ROOM TURNOVER OR (KITS) ×3 IMPLANT
NDL HYPO 25GX1X1/2 BEV (NEEDLE) ×1 IMPLANT
NEEDLE HYPO 25GX1X1/2 BEV (NEEDLE) ×3 IMPLANT
NS IRRIG 1000ML POUR BTL (IV SOLUTION) ×3 IMPLANT
PACK SURGICAL SETUP 50X90 (CUSTOM PROCEDURE TRAY) ×3 IMPLANT
PAD ARMBOARD 7.5X6 YLW CONV (MISCELLANEOUS) ×3 IMPLANT
PENCIL BUTTON HOLSTER BLD 10FT (ELECTRODE) ×3 IMPLANT
SPECIMEN JAR SMALL (MISCELLANEOUS) ×3 IMPLANT
SPONGE LAP 18X18 X RAY DECT (DISPOSABLE) ×3 IMPLANT
STRIP CLOSURE SKIN 1/2X4 (GAUZE/BANDAGES/DRESSINGS) ×2 IMPLANT
SUT MNCRL AB 4-0 PS2 18 (SUTURE) ×3 IMPLANT
SUT VIC AB 2-0 SH 27 (SUTURE) ×3
SUT VIC AB 2-0 SH 27X BRD (SUTURE) IMPLANT
SUT VIC AB 3-0 SH 27 (SUTURE) ×3
SUT VIC AB 3-0 SH 27XBRD (SUTURE) ×1 IMPLANT
SYR BULB 3OZ (MISCELLANEOUS) ×3 IMPLANT
SYR CONTROL 10ML LL (SYRINGE) ×3 IMPLANT
TOWEL OR 17X24 6PK STRL BLUE (TOWEL DISPOSABLE) ×3 IMPLANT
TOWEL OR 17X26 10 PK STRL BLUE (TOWEL DISPOSABLE) ×3 IMPLANT
TUBE CONNECTING 12'X1/4 (SUCTIONS) ×1
TUBE CONNECTING 12X1/4 (SUCTIONS) ×1 IMPLANT
YANKAUER SUCT BULB TIP NO VENT (SUCTIONS) ×2 IMPLANT

## 2016-02-14 NOTE — Anesthesia Preprocedure Evaluation (Addendum)
Anesthesia Evaluation  Patient identified by MRN, date of birth, ID band Patient awake    Reviewed: Allergy & Precautions, NPO status , Patient's Chart, lab work & pertinent test results  Airway Mallampati: II  TM Distance: >3 FB Neck ROM: Full    Dental  (+) Teeth Intact, Missing, Poor Dentition   Pulmonary neg pulmonary ROS, Current Smoker,    breath sounds clear to auscultation       Cardiovascular negative cardio ROS   Rhythm:Regular Rate:Normal     Neuro/Psych    GI/Hepatic negative GI ROS, Neg liver ROS,   Endo/Other    Renal/GU Renal disease     Musculoskeletal   Abdominal (+) + obese,   Peds  Hematology   Anesthesia Other Findings   Reproductive/Obstetrics                            Anesthesia Physical Anesthesia Plan  ASA: II  Anesthesia Plan: General   Post-op Pain Management:    Induction: Intravenous  Airway Management Planned: LMA  Additional Equipment:   Intra-op Plan:   Post-operative Plan: Extubation in OR  Informed Consent: I have reviewed the patients History and Physical, chart, labs and discussed the procedure including the risks, benefits and alternatives for the proposed anesthesia with the patient or authorized representative who has indicated his/her understanding and acceptance.   Dental advisory given  Plan Discussed with:   Anesthesia Plan Comments:         Anesthesia Quick Evaluation

## 2016-02-14 NOTE — H&P (Signed)
History of Present Illness  Patient words: lipoma.  The patient is a 45 year old male who presents with a complaint of Mass. Patient referred by Boykin Nearing, MD for evaluation of right lateral abdominal wall mass. This is a 45 year old male who previously had a right lower quadrant intramuscular lipoma excised in 2007 by Dr. Milana Kidney. The patient developed another mass more laterally last year. This has become fairly large. 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 He presents now to discuss excision. Other Problems  Back Pain Hypercholesterolemia Other disease, cancer, significant illness  Past Surgical History Knee Surgery Left. Resection of Stomach  Diagnostic Studies History  Colonoscopy never  Allergies  No Known Drug Allergies   Medication History  Atorvastatin Calcium (10MG  Tablet, Oral) Active. Multiple Vitamin (Oral) Active. Medications Reconciled  Social History  Alcohol use Occasional alcohol use. Caffeine use Carbonated beverages, Coffee, Tea. Illicit drug use Remotely quit drug use. Tobacco use Current every day smoker.  Family History  Alcohol Abuse Brother, Father, Mother. Cervical Cancer Family Members In General. Depression Brother. Hypertension Father.     Review of Systems  General Present- Weight Gain. Not Present- Appetite Loss, Chills, Fatigue, Fever, Night Sweats and Weight Loss. Skin Present- Dryness. Not Present- Change in Wart/Mole, Hives, Jaundice, New Lesions, Non-Healing Wounds, Rash and Ulcer. HEENT Present- Seasonal Allergies. Not Present- Earache, Hearing Loss, Hoarseness, Nose Bleed, Oral Ulcers, Ringing in the Ears, Sinus Pain, Sore Throat, Visual Disturbances, Wears glasses/contact lenses and Yellow Eyes. Respiratory Present- Difficulty Breathing. Not Present- Bloody sputum, Chronic Cough, Snoring and Wheezing. Breast  Not Present- Breast Mass, Breast Pain, Nipple Discharge and Skin Changes. Cardiovascular Not Present- Chest Pain, Difficulty Breathing Lying Down, Leg Cramps, Palpitations, Rapid Heart Rate, Shortness of Breath and Swelling of Extremities. Gastrointestinal Present- Abdominal Pain. Not Present- Bloating, Bloody Stool, Change in Bowel Habits, Chronic diarrhea, Constipation, Difficulty Swallowing, Excessive gas, Gets full quickly at meals, Hemorrhoids, Indigestion, Nausea, Rectal Pain and Vomiting. Male Genitourinary Present- Frequency and Nocturia. Not Present- Blood in Urine, Change in Urinary Stream, Impotence, Painful Urination, Urgency and Urine Leakage.  Vitals  Weight: 185.6 lb Height: 69in Body Surface Area: 2.02 m Body Mass Index: 27.41 kg/m Temp.: 98.52F(Oral)  Pulse: 80 (Regular)  BP: 110/80 (Sitting, Left Arm, Standard)     Physical Exam   The physical exam findings are as follows: Note:WDWN in NAD HEENT: EOMI, sclera anicteric Neck: No masses, no thyromegaly Lungs: CTA bilaterally; normal respiratory effort CV: Regular rate and rhythm; no murmurs Abd: +bowel sounds, soft, non-tender, Right lateral abdominal wall above iliac crest - 8 cm subcutaneous mass; smooth; mobile Ext: Well-perfused; no edema Skin: Warm, dry; no sign of jaundice    Assessment & Plan   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 The surgical procedure has been discussed with the patient. Potential risks, benefits, alternative treatments, and expected outcomes have been explained. All of the patient's questions at this time have been answered. The likelihood of reaching the patient's treatment goal is good. The patient understand the proposed surgical procedure and wishes to proceed.   Imogene Burn. Georgette Dover, MD, [facility]  General/ Trauma Surgery  02/14/2016 2:44 PM

## 2016-02-14 NOTE — Discharge Instructions (Signed)
[facility] _______[facility]   POST OP INSTRUCTIONS  Always review your discharge instruction sheet given to you by the facility where your surgery was performed. IF YOU HAVE DISABILITY OR FAMILY LEAVE FORMS, YOU MUST BRING THEM TO THE OFFICE FOR PROCESSING.   DO NOT GIVE THEM TO YOUR DOCTOR.  1. A  prescription for pain medication may be given to you upon discharge.  Take your pain medication as prescribed, if needed.  If narcotic pain medicine is not needed, then you may take acetaminophen (Tylenol) or ibuprofen (Advil) as needed. 2. Take your usually prescribed medications unless otherwise directed. If you need a refill on your pain medication, please contact your pharmacy.  They will contact our office to request authorization. Prescriptions will not be filled after 5 pm or on week-ends. 3. You should follow a light diet the first 24 hours after arrival home, such as soup and crackers, etc.  Be sure to include lots of fluids daily.  Resume your normal diet the day after surgery. 4.Most patients will experience some swelling and bruising around the incision.  Ice packs and reclining will help.  Swelling and bruising can take several days to resolve.  6. It is common to experience some constipation if taking pain medication after surgery.  Increasing fluid intake and taking a stool softener (such as Colace) will usually help or prevent this problem from occurring.  A mild laxative (Milk of Magnesia or Miralax) should be taken according to package directions if there are no bowel movements after 48 hours. 7. Unless discharge instructions indicate otherwise, you may remove your bandages 24-48 hours after surgery, and you may shower at that time.  You may have steri-strips (small skin tapes) in place directly over the incision.  These strips should be left on the skin for 7-10 days.  8. ACTIVITIES:  You may resume regular (light) daily activities beginning the next day--such as daily  self-care, walking, climbing stairs--gradually increasing activities as tolerated.  You may have sexual intercourse when it is comfortable.  Refrain from any heavy lifting or straining until approved by your doctor.  a.You may drive when you are no longer taking prescription pain medication, you can comfortably wear a seatbelt, and you can safely maneuver your car and apply brakes. b.RETURN TO WORK:   _____________________________________________  9.You should see your doctor in the office for a follow-up appointment approximately 2-3 weeks after your surgery.  Make sure that you call for this appointment within a day or two after you arrive home to insure a convenient appointment time. 10.OTHER INSTRUCTIONS: _________________________    _____________________________________  WHEN TO CALL YOUR DOCTOR: 1. Fever over 101.0 2. Inability to urinate 3. Nausea and/or vomiting 4. Extreme swelling or bruising 5. Continued bleeding from incision. 6. Increased pain, redness, or drainage from the incision  The clinic staff is available to answer your questions during regular business hours.  Please dont hesitate to call and ask to speak to one of the nurses for clinical concerns.  If you have a medical emergency, go to the nearest emergency room or call 911.  A surgeon from [facility] is always on call at the hospital   [address], [place], [place], [place]  [address] ?  [address], [place], [place]   [address] [phone] ? [phone] ? FAX [phone] Web site: [email or web address]

## 2016-02-14 NOTE — Transfer of Care (Signed)
Immediate Anesthesia Transfer of Care Note  Patient: Charles Wise  Procedure(s) Performed: Procedure(s): EXCISION OF SUBCUTANEOUS LIPOMA RIGHT FLANK (Right)  Patient Location: PACU  Anesthesia Type:General  Level of Consciousness: awake  Airway & Oxygen Therapy: Patient Spontanous Breathing and Patient connected to nasal cannula oxygen  Post-op Assessment: Report given to RN  Post vital signs: Reviewed and stable  Last Vitals:  Vitals:   02/14/16 1624  Temp: 36.4 C    Last Pain: There were no vitals filed for this visit.    Patients Stated Pain Goal: 3 ([id number])  Complications: No apparent anesthesia complications

## 2016-02-14 NOTE — Op Note (Signed)
Pre-op Diagnosis:  Subcutaneous lipoma - right flank - 8 cm Post-op Diagnosis:  Intramuscular lipoma - right flank - 8 cm Procedure:  Excision of intramuscular lipoma right flank  Surgeon:Zyann Mabry K. Anesthesia: Gen. via LMA Indications: This is a 45 year old male with several years of a slowly enlarging mass on his right flank just above his iliac crest. This has become quite uncomfortable and pain radiates down into his right pelvis. He presents now for excision. He has had a previous lipoma excised on the right side of his abdomen before. This lipoma seems to be in a different area.  Description of procedure: The patient spottily operating room and placed in the supine position on the operating room table. After an adequate level of general anesthesia was obtained, his abdomen was prepped with ChloraPrep and draped sterile fashion. A timeout was taken to ensure the proper patient and proper procedure. We made an oblique incision directly over the long axis of the mass. We dissected down into the subcutaneous tissues with cautery. We immediately encountered the external oblique muscle. It became apparent that this was actually an intramuscular lipoma instead of a subcutaneous lipoma. We split the anterior fascia of the external oblique muscle and dissected down into the muscle. We identified the surface of the lipoma. We bluntly dissected around the anterior surface of the lipoma. As we continue our dissection around the lipoma, there were some dense adhesions to the underlying muscle. We were able to use cautery to dissect the muscle away from the lipoma. We removed the entire lipoma. The wound was inspected for hemostasis. We irrigated thoroughly. The fascia was closed with 2-0 Vicryl. The skin was closed with 4-0 Monocryl. Benzoin Steri-Strips were applied. The patient was then extubated and brought to the recovery room in stable condition. All sponge, instrument, and needle counts are  correct.  Imogene Burn. Georgette Dover, MD, [facility]  General/ Trauma Surgery  02/14/2016 4:29 PM

## 2016-02-14 NOTE — Anesthesia Postprocedure Evaluation (Signed)
Anesthesia Post Note  Patient: Charles Wise  Procedure(s) Performed: Procedure(s) (LRB): EXCISION OF SUBCUTANEOUS LIPOMA RIGHT FLANK (Right)  Patient location during evaluation: PACU Anesthesia Type: General Level of consciousness: awake and alert Pain management: pain level controlled Vital Signs Assessment: post-procedure vital signs reviewed and stable Respiratory status: spontaneous breathing, nonlabored ventilation, respiratory function stable and patient connected to nasal cannula oxygen Cardiovascular status: blood pressure returned to baseline and stable Postop Assessment: no signs of nausea or vomiting Anesthetic complications: no       Last Vitals:  Vitals:   02/14/16 1625 02/14/16 1659  BP: 134/63 133/76  Pulse: 62 (!) 57  Resp: 13 20  Temp:      Last Pain: There were no vitals filed for this visit.               Toussaint Golson,JAMES TERRILL

## 2016-02-14 NOTE — Anesthesia Procedure Notes (Signed)
Procedure Name: LMA Insertion Date/Time: 02/14/2016 3:30 PM Performed by: Sampson Si E Pre-anesthesia Checklist: Patient identified, Emergency Drugs available, Suction available and Patient being monitored Patient Re-evaluated:Patient Re-evaluated prior to inductionOxygen Delivery Method: Circle System Utilized Preoxygenation: Pre-oxygenation with 100% oxygen Intubation Type: IV induction Ventilation: Mask ventilation without difficulty LMA: LMA inserted LMA Size: 4.0 Number of attempts: 1 Placement Confirmation: positive ETCO2 Tube secured with: Tape Dental Injury: Teeth and Oropharynx as per pre-operative assessment

## 2016-02-15 ENCOUNTER — Encounter (HOSPITAL_COMMUNITY): Payer: Self-pay | Admitting: Surgery

## 2016-07-10 ENCOUNTER — Encounter: Payer: Self-pay | Admitting: Family Medicine

## 2017-07-28 ENCOUNTER — Encounter (HOSPITAL_COMMUNITY): Payer: Self-pay | Admitting: Emergency Medicine

## 2017-07-28 ENCOUNTER — Ambulatory Visit (INDEPENDENT_AMBULATORY_CARE_PROVIDER_SITE_OTHER): Payer: Self-pay

## 2017-07-28 ENCOUNTER — Other Ambulatory Visit: Payer: Self-pay

## 2017-07-28 ENCOUNTER — Ambulatory Visit (HOSPITAL_COMMUNITY)
Admission: EM | Admit: 2017-07-28 | Discharge: 2017-07-28 | Disposition: A | Discharge status: Home / Self Care | Payer: Self-pay | Attending: Family Medicine | Admitting: Family Medicine

## 2017-07-28 DIAGNOSIS — R0789 Other chest pain: Secondary | ICD-10-CM | POA: Insufficient documentation

## 2017-07-28 DIAGNOSIS — R519 Headache, unspecified: Secondary | ICD-10-CM

## 2017-07-28 DIAGNOSIS — F17210 Nicotine dependence, cigarettes, uncomplicated: Secondary | ICD-10-CM | POA: Insufficient documentation

## 2017-07-28 DIAGNOSIS — R51 Headache: Secondary | ICD-10-CM

## 2017-07-28 DIAGNOSIS — Z79899 Other long term (current) drug therapy: Secondary | ICD-10-CM | POA: Insufficient documentation

## 2017-07-28 DIAGNOSIS — Z79891 Long term (current) use of opiate analgesic: Secondary | ICD-10-CM | POA: Insufficient documentation

## 2017-07-28 DIAGNOSIS — H538 Other visual disturbances: Secondary | ICD-10-CM

## 2017-07-28 LAB — SEDIMENTATION RATE: Sed Rate: 5 mm/hr (ref 0–16)

## 2017-07-28 LAB — C-REACTIVE PROTEIN

## 2017-07-28 MED ORDER — NAPROXEN 500 MG PO TABS: 500.0000 mg | ORAL_TABLET | Freq: Two times a day (BID) | ORAL | 0 refills | Status: DC

## 2017-07-28 MED ORDER — DEXAMETHASONE SODIUM PHOSPHATE 10 MG/ML IJ SOLN
INTRAMUSCULAR | Status: AC
  Filled 2017-07-28: qty 1

## 2017-07-28 MED ORDER — KETOROLAC TROMETHAMINE 60 MG/2ML IM SOLN
INTRAMUSCULAR | Status: AC
  Filled 2017-07-28: qty 2

## 2017-07-28 MED ORDER — DEXAMETHASONE SODIUM PHOSPHATE 10 MG/ML IJ SOLN
10.0000 mg | Freq: Once | INTRAMUSCULAR | Status: AC
  Administered 2017-07-28: 10 mg via INTRAMUSCULAR

## 2017-07-28 MED ORDER — KETOROLAC TROMETHAMINE 60 MG/2ML IM SOLN
60.0000 mg | Freq: Once | INTRAMUSCULAR | Status: AC
  Administered 2017-07-28: 60 mg via INTRAMUSCULAR

## 2017-07-28 NOTE — ED Triage Notes (Signed)
Patient presents to [facility] for headache and blurred vision since today, pt also complains of sharp pain in upper left shoulder pain tat radiates from side to side.
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
## 2017-07-28 NOTE — Discharge Instructions (Signed)
Drink plenty of fluids.  Rest. Naproxen twice  day for chest wall pain and/or headache. Take with food. May take first dose tomorrow morning. If develop vision changes, worsening of headache, nausea, vomiting, worsening of chest pain, dizziness, nausea, sweating please go to Er.  We will call you with any abnormal test findings.

## 2018-08-03 ENCOUNTER — Encounter (HOSPITAL_COMMUNITY): Payer: Self-pay | Admitting: Emergency Medicine

## 2018-08-03 ENCOUNTER — Other Ambulatory Visit: Payer: Self-pay

## 2018-08-03 ENCOUNTER — Ambulatory Visit (HOSPITAL_COMMUNITY)
Admission: EM | Admit: 2018-08-03 | Discharge: 2018-08-03 | Disposition: A | Discharge status: Home / Self Care | Payer: BC Managed Care – PPO | Attending: Family Medicine | Admitting: Family Medicine

## 2018-08-03 DIAGNOSIS — S39011A Strain of muscle, fascia and tendon of abdomen, initial encounter: Secondary | ICD-10-CM | POA: Diagnosis not present

## 2018-08-03 DIAGNOSIS — X500XXA Overexertion from strenuous movement or load, initial encounter: Secondary | ICD-10-CM | POA: Diagnosis not present

## 2018-08-03 LAB — POCT URINALYSIS DIP (DEVICE)
Bilirubin Urine: NEGATIVE
Glucose, UA: NEGATIVE mg/dL
Hgb urine dipstick: NEGATIVE
Ketones, ur: NEGATIVE mg/dL
Leukocytes,Ua: NEGATIVE
Nitrite: NEGATIVE
Protein, ur: NEGATIVE mg/dL
Specific Gravity, Urine: 1.025 (ref 1.005–1.030)
Urobilinogen, UA: 0.2 mg/dL (ref 0.0–1.0)
pH: 7 (ref 5.0–8.0)

## 2018-08-03 MED ORDER — NAPROXEN 500 MG PO TABS: 500.0000 mg | ORAL_TABLET | Freq: Two times a day (BID) | ORAL | 0 refills | Status: DC
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
## 2018-08-03 NOTE — ED Triage Notes (Signed)
Patient was lifting concrete bags today and felt pain in right mid to lower right abdomen.  No nausea, no vomiting.  Denies urinary symptoms.

## 2018-08-03 NOTE — Discharge Instructions (Signed)
Ice application, naproxen twice a day, take with food.  Limit heavy lifting.  Your urine is normal.  Things also to consider would be abdominal hernia or appendicitis. If your pain worsens, is worse with movement, develop any fevers, nausea, diarrhea, or otherwise worsening please go to the ER.

## 2018-08-11 ENCOUNTER — Other Ambulatory Visit: Payer: Self-pay

## 2018-08-11 ENCOUNTER — Ambulatory Visit: Payer: BC Managed Care – PPO | Attending: Critical Care Medicine | Admitting: Critical Care Medicine

## 2018-08-11 ENCOUNTER — Encounter: Payer: Self-pay | Admitting: Critical Care Medicine

## 2018-08-11 VITALS — BP 133/78 | HR 78 | Temp 98.8°F | Resp 18 | Ht 69.0 in | Wt 170.0 lb

## 2018-08-11 DIAGNOSIS — N400 Enlarged prostate without lower urinary tract symptoms: Secondary | ICD-10-CM | POA: Diagnosis not present

## 2018-08-11 DIAGNOSIS — S39011D Strain of muscle, fascia and tendon of abdomen, subsequent encounter: Secondary | ICD-10-CM

## 2018-08-11 DIAGNOSIS — F17200 Nicotine dependence, unspecified, uncomplicated: Secondary | ICD-10-CM

## 2018-08-11 DIAGNOSIS — S39011A Strain of muscle, fascia and tendon of abdomen, initial encounter: Secondary | ICD-10-CM | POA: Insufficient documentation

## 2018-08-11 DIAGNOSIS — N529 Male erectile dysfunction, unspecified: Secondary | ICD-10-CM

## 2018-08-11 MED ORDER — TAMSULOSIN HCL 0.4 MG PO CAPS: 0.4000 mg | ORAL_CAPSULE | Freq: Every day | ORAL | 3 refills | Status: DC

## 2018-08-11 NOTE — Patient Instructions (Signed)
A tetanus vaccine was given today  Begin Flomax 1 daily to help improve urine flow  A prostatic specific antigen blood test was obtained today  Return in about a month for a complete rectal exam  We will get you established with primary care in the future  Continue to take Naprosyn as needed for your abdominal pain you should be able to taper that off  Focus on smoking cessation and use nicotine lozenges I would suggest a 4 mg lozenge dissolve 1 of those in your mouth 2-3 times daily and you can obtain this over-the-counter

## 2018-08-11 NOTE — Assessment & Plan Note (Addendum)
Difficulty with urine flow suspect enlarged prostate will also need to rule out prostate cancer  Begin tamsulosin 0.4 mg daily Obtain PSA today Return for full rectal genitourinary exam

## 2018-08-11 NOTE — Progress Notes (Signed)
Subjective:    Patient ID: Charles Wise, male    DOB: Feb 11, 1971, 48 y.o.   MRN: [id number]  47 y.o.M not seen in this clinic since 2016. Here for post ED f/u of abdominal pain due to muscle strain and also needs to reestablish in the clinic  Additional concerns raised by the patient today include difficulty with urination and not being able to completely empty his bladder.  Also an issue of erectile dysfunction has been raised.   As well the patient is actively smoking a pack a day of tobacco.  Note when the patient was in the emergency room on 8 June the patient had a normal urinalysis.  The exam was consistent with abdominal muscle strain.  The patient was given Naprosyn twice daily as needed he states this is helped the pain.  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  He has no prior history of hypertension or diabetes.  He is thought about quitting smoking but has not made any efforts toward this as of yet.     Past Medical History:  Diagnosis Date  . Acute renal failure ([place]) 09/07/2014  . Headache   . High cholesterol   . 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 Renal failure 08/2014   resolved     Family History  Problem Relation Age of Onset  . Heart failure Father   . Stroke Other   . Cancer Neg Hx   . Diabetes Neg Hx   . Heart disease Neg Hx   . Hypertension Neg Hx      Social History   Socioeconomic History  . Marital status: Single    Spouse name: Not on file  . Number of children: 4  . Years of education: 49  . Highest education  level: Not on file  Occupational History  . Occupation: [employer]    Comment: heavy lifting required   . Occupation: Environmental health practitioner    Comment: second job   Social Needs  . Financial resource strain: Not on file  . Food insecurity    Worry: Not on file    Inability: Not on file  . Transportation needs    Medical: Not on file    Non-medical: Not on file  Tobacco Use  . Smoking status: Current Every Day Smoker    Packs/day: 0.25    Years: 30.00    Pack years: 7.50    Types: Cigarettes  . Smokeless tobacco: Never Used  Substance and Sexual Activity  . Alcohol use: Yes    Alcohol/week: 0.0 standard drinks    Comment: rare   . Drug use: No  . Sexual activity: Yes    Birth control/protection: Condom  Lifestyle  . Physical activity    Days per week: Not on file    Minutes per session: Not on file  . Stress: Not on file  Relationships  . Social connections    Talks on phone: Not on file  Gets together: Not on file    Attends religious service: Not on file    Active member of club or organization: Not on file    Attends meetings of clubs or organizations: Not on file    Relationship status: Not on file  . Intimate partner violence    Fear of current or ex partner: Not on file    Emotionally abused: Not on file    Physically abused: Not on file    Forced sexual activity: Not on file  Other Topics Concern  . Not on file  Social History Narrative   Lives alone    4 children   3 boys and 1 girl   Mom nearby and supportive      No Known Allergies   Outpatient Medications Prior to Visit  Medication Sig Dispense Refill  . naproxen (NAPROSYN) 500 MG tablet Take 1 tablet (500 mg total) by mouth 2 (two) times daily. 30 tablet 0   No facility-administered medications prior to visit.      Review of Systems  Constitutional: Negative.   HENT: Negative.   Eyes: Negative.   Respiratory: Negative.   Cardiovascular: Negative.   Gastrointestinal: Positive for  abdominal pain. Negative for abdominal distention, anal bleeding, blood in stool, constipation, diarrhea, nausea, rectal pain and vomiting.  Endocrine: Negative.   Genitourinary: Positive for decreased urine volume, difficulty urinating, frequency and urgency. Negative for discharge, dysuria, enuresis, flank pain, genital sores, hematuria, penile pain, penile swelling, scrotal swelling and testicular pain.  Musculoskeletal: Negative.   Skin: Negative.   Allergic/Immunologic: Negative.   Neurological: Negative.   Hematological: Negative.   Psychiatric/Behavioral: Negative.        Objective:   Physical Exam Vitals:   08/11/18 1550  BP: 133/78  Pulse: 78  Resp: 18  Temp: 98.8 F (37.1 C)  TempSrc: Oral  SpO2: 98%  Weight: 170 lb (77.1 kg)  Height: 5\' 9"  (1.753 m)    Gen: Pleasant, well-nourished, in no distress,  normal affect  ENT: No lesions,  mouth clear,  oropharynx clear, no postnasal drip  Neck: No JVD, no TMG, no carotid bruits  Lungs: No use of accessory muscles, no dullness to percussion, clear without rales or rhonchi  Cardiovascular: RRR, heart sounds normal, no murmur or gallops, no peripheral edema  Abdomen: soft and NT, no HSM,  BS normal  Musculoskeletal: No deformities, no cyanosis or clubbing  Neuro: alert, non focal  Skin: Warm, no lesions or rashes The pt declined a rectal exam today UA normal 6/8     Assessment & Plan:  I personally reviewed all images and lab data in the [facility] system as well as any outside material available during this office visit and agree with the  radiology impressions.   Abdominal muscle strain Abdominal muscle strain now resolving  The patient can slowly taper off the Naprosyn and discontinue  Prostatism Difficulty with urine flow suspect enlarged prostate will also need to rule out prostate cancer  Begin tamsulosin 0.4 mg daily Obtain PSA today Return for full rectal genitourinary exam  Erectile dysfunction  History of erectile dysfunction  I suggested we may want to refer to urology since he does have insurance coverage  Current smoker Ongoing smoking use  I spent about 10 minutes with smoking cessation counseling with this patient  Recommended nicotine replacement therapy with nicotine lozenges 4 mg 2-3 times daily as needed this can be obtained over-the-counter   Myrtle was seen today for hospitalization follow-up.  Diagnoses  and all orders for this visit:  Strain of abdominal muscle, subsequent encounter  Prostatism -     PSA  Vasculogenic erectile dysfunction, unspecified vasculogenic erectile dysfunction type  Current smoker  Other orders -     tamsulosin (FLOMAX) 0.4 MG CAPS capsule; Take 1 capsule (0.4 mg total) by mouth daily.   A tetanus vaccine was given at this visit

## 2018-08-11 NOTE — Assessment & Plan Note (Signed)
History of erectile dysfunction  I suggested we may want to refer to urology since he does have insurance coverage

## 2018-08-11 NOTE — Assessment & Plan Note (Signed)
Ongoing smoking use  I spent about 10 minutes with smoking cessation counseling with this patient  Recommended nicotine replacement therapy with nicotine lozenges 4 mg 2-3 times daily as needed this can be obtained over-the-counter

## 2018-08-11 NOTE — Assessment & Plan Note (Signed)
Abdominal muscle strain now resolving  The patient can slowly taper off the Naprosyn and discontinue

## 2018-08-12 LAB — PSA: Prostate Specific Ag, Serum: 0.6 ng/mL (ref 0.0–4.0)

## 2018-08-13 ENCOUNTER — Telehealth: Payer: Self-pay | Admitting: Emergency Medicine

## 2018-08-13 NOTE — Telephone Encounter (Signed)
Patient contacted via phone to be given results of labs.  Patient identified by name and date of birth.  Patient given results of labs.  Patient educated on lab results. Questions answered. Patient acknowledged understanding of labs results. 

## 2018-09-12 ENCOUNTER — Encounter (HOSPITAL_COMMUNITY): Payer: Self-pay | Admitting: *Deleted

## 2018-09-12 ENCOUNTER — Emergency Department (HOSPITAL_COMMUNITY): Payer: BC Managed Care – PPO

## 2018-09-12 ENCOUNTER — Other Ambulatory Visit: Payer: Self-pay

## 2018-09-12 ENCOUNTER — Inpatient Hospital Stay (HOSPITAL_COMMUNITY)
Admission: EM | Admit: 2018-09-12 | Discharge: 2018-09-14 | DRG: 155 | Disposition: A | Discharge status: Home / Self Care | Payer: BC Managed Care – PPO | Attending: General Surgery | Admitting: General Surgery

## 2018-09-12 DIAGNOSIS — I959 Hypotension, unspecified: Secondary | ICD-10-CM | POA: Diagnosis present

## 2018-09-12 DIAGNOSIS — S022XXA Fracture of nasal bones, initial encounter for closed fracture: Principal | ICD-10-CM | POA: Diagnosis present

## 2018-09-12 DIAGNOSIS — S42001A Fracture of unspecified part of right clavicle, initial encounter for closed fracture: Secondary | ICD-10-CM | POA: Diagnosis not present

## 2018-09-12 DIAGNOSIS — R402431 Glasgow coma scale score 3-8, in the field [EMT or ambulance]: Secondary | ICD-10-CM | POA: Diagnosis not present

## 2018-09-12 DIAGNOSIS — F17200 Nicotine dependence, unspecified, uncomplicated: Secondary | ICD-10-CM | POA: Diagnosis present

## 2018-09-12 DIAGNOSIS — S8251XA Displaced fracture of medial malleolus of right tibia, initial encounter for closed fracture: Secondary | ICD-10-CM | POA: Diagnosis not present

## 2018-09-12 DIAGNOSIS — Y906 Blood alcohol level of 120-199 mg/100 ml: Secondary | ICD-10-CM | POA: Diagnosis present

## 2018-09-12 DIAGNOSIS — K08419 Partial loss of teeth due to trauma, unspecified class: Secondary | ICD-10-CM | POA: Diagnosis present

## 2018-09-12 DIAGNOSIS — S27899A Unspecified injury of other specified intrathoracic organs, initial encounter: Secondary | ICD-10-CM

## 2018-09-12 DIAGNOSIS — S01511A Laceration without foreign body of lip, initial encounter: Secondary | ICD-10-CM | POA: Diagnosis present

## 2018-09-12 DIAGNOSIS — Z87828 Personal history of other (healed) physical injury and trauma: Secondary | ICD-10-CM

## 2018-09-12 DIAGNOSIS — S42021A Displaced fracture of shaft of right clavicle, initial encounter for closed fracture: Secondary | ICD-10-CM | POA: Diagnosis not present

## 2018-09-12 DIAGNOSIS — S82891A Other fracture of right lower leg, initial encounter for closed fracture: Secondary | ICD-10-CM

## 2018-09-12 DIAGNOSIS — Z20828 Contact with and (suspected) exposure to other viral communicable diseases: Secondary | ICD-10-CM | POA: Diagnosis present

## 2018-09-12 DIAGNOSIS — Y92411 Interstate highway as the place of occurrence of the external cause: Secondary | ICD-10-CM

## 2018-09-12 DIAGNOSIS — S0240CA Maxillary fracture, right side, initial encounter for closed fracture: Secondary | ICD-10-CM | POA: Diagnosis present

## 2018-09-12 DIAGNOSIS — S42017A Nondisplaced fracture of sternal end of right clavicle, initial encounter for closed fracture: Secondary | ICD-10-CM | POA: Diagnosis present

## 2018-09-12 DIAGNOSIS — K0889 Other specified disorders of teeth and supporting structures: Secondary | ICD-10-CM | POA: Diagnosis present

## 2018-09-12 DIAGNOSIS — S8254XA Nondisplaced fracture of medial malleolus of right tibia, initial encounter for closed fracture: Secondary | ICD-10-CM | POA: Diagnosis not present

## 2018-09-12 DIAGNOSIS — Z23 Encounter for immunization: Secondary | ICD-10-CM | POA: Diagnosis not present

## 2018-09-12 DIAGNOSIS — S01111A Laceration without foreign body of right eyelid and periocular area, initial encounter: Secondary | ICD-10-CM | POA: Diagnosis present

## 2018-09-12 DIAGNOSIS — S3991XA Unspecified injury of abdomen, initial encounter: Secondary | ICD-10-CM | POA: Diagnosis not present

## 2018-09-12 DIAGNOSIS — R402412 Glasgow coma scale score 13-15, at arrival to emergency department: Secondary | ICD-10-CM | POA: Diagnosis not present

## 2018-09-12 DIAGNOSIS — F1010 Alcohol abuse, uncomplicated: Secondary | ICD-10-CM

## 2018-09-12 DIAGNOSIS — Z03818 Encounter for observation for suspected exposure to other biological agents ruled out: Secondary | ICD-10-CM | POA: Diagnosis not present

## 2018-09-12 DIAGNOSIS — F10129 Alcohol abuse with intoxication, unspecified: Secondary | ICD-10-CM | POA: Diagnosis present

## 2018-09-12 DIAGNOSIS — S0990XA Unspecified injury of head, initial encounter: Secondary | ICD-10-CM | POA: Diagnosis not present

## 2018-09-12 DIAGNOSIS — S199XXA Unspecified injury of neck, initial encounter: Secondary | ICD-10-CM | POA: Diagnosis not present

## 2018-09-12 DIAGNOSIS — R51 Headache: Secondary | ICD-10-CM | POA: Diagnosis not present

## 2018-09-12 DIAGNOSIS — S27321A Contusion of lung, unilateral, initial encounter: Secondary | ICD-10-CM | POA: Diagnosis present

## 2018-09-12 DIAGNOSIS — S3993XA Unspecified injury of pelvis, initial encounter: Secondary | ICD-10-CM | POA: Diagnosis not present

## 2018-09-12 DIAGNOSIS — R000 Tachycardia, unspecified: Secondary | ICD-10-CM | POA: Diagnosis not present

## 2018-09-12 DIAGNOSIS — S299XXA Unspecified injury of thorax, initial encounter: Secondary | ICD-10-CM | POA: Diagnosis not present

## 2018-09-12 DIAGNOSIS — S02401A Maxillary fracture, unspecified, initial encounter for closed fracture: Secondary | ICD-10-CM

## 2018-09-12 DIAGNOSIS — F10920 Alcohol use, unspecified with intoxication, uncomplicated: Secondary | ICD-10-CM

## 2018-09-12 LAB — BLOOD PRODUCT ORDER (VERBAL) VERIFICATION

## 2018-09-12 LAB — RAPID URINE DRUG SCREEN, HOSP PERFORMED
Amphetamines: NOT DETECTED
Barbiturates: NOT DETECTED
Benzodiazepines: NOT DETECTED
Cocaine: NOT DETECTED
Opiates: NOT DETECTED
Tetrahydrocannabinol: NOT DETECTED

## 2018-09-12 LAB — PREPARE FRESH FROZEN PLASMA
Unit division: 0
Unit division: 0

## 2018-09-12 LAB — BPAM RBC
Blood Product Expiration Date: 202008162359
Blood Product Expiration Date: 202008162359
ISSUE DATE / TIME: 202007180425
ISSUE DATE / TIME: 202007180425
Unit Type and Rh: 5100
Unit Type and Rh: 5100

## 2018-09-12 LAB — I-STAT CHEM 8, ED
BUN: 15 mg/dL (ref 6–20)
Calcium, Ion: 0.97 mmol/L — ABNORMAL LOW (ref 1.15–1.40)
Chloride: 106 mmol/L (ref 98–111)
Creatinine, Ser: 1.4 mg/dL — ABNORMAL HIGH (ref 0.61–1.24)
Glucose, Bld: 91 mg/dL (ref 70–99)
HCT: 46 % (ref 39.0–52.0)
Hemoglobin: 15.6 g/dL (ref 13.0–17.0)
Potassium: 3.5 mmol/L (ref 3.5–5.1)
Sodium: 140 mmol/L (ref 135–145)
TCO2: 21 mmol/L — ABNORMAL LOW (ref 22–32)

## 2018-09-12 LAB — COMPREHENSIVE METABOLIC PANEL
ALT: 41 U/L (ref 0–44)
AST: 76 U/L — ABNORMAL HIGH (ref 15–41)
Albumin: 4.1 g/dL (ref 3.5–5.0)
Alkaline Phosphatase: 96 U/L (ref 38–126)
Anion gap: 12 (ref 5–15)
BUN: 14 mg/dL (ref 6–20)
CO2: 20 mmol/L — ABNORMAL LOW (ref 22–32)
Calcium: 8.9 mg/dL (ref 8.9–10.3)
Chloride: 106 mmol/L (ref 98–111)
Creatinine, Ser: 1.29 mg/dL — ABNORMAL HIGH (ref 0.61–1.24)
GFR calc Af Amer: >60 mL/min (ref >60)
GFR calc non Af Amer: >60 mL/min (ref >60)
Glucose, Bld: 96 mg/dL (ref 70–99)
Potassium: 3.8 mmol/L (ref 3.5–5.1)
Sodium: 138 mmol/L (ref 135–145)
Total Bilirubin: 0.6 mg/dL (ref 0.3–1.2)
Total Protein: 7 g/dL (ref 6.5–8.1)

## 2018-09-12 LAB — LACTIC ACID, PLASMA: Lactic Acid, Venous: 3.4 mmol/L (ref 0.5–1.9)

## 2018-09-12 LAB — URINALYSIS, ROUTINE W REFLEX MICROSCOPIC
Bacteria, UA: NONE SEEN
Bilirubin Urine: NEGATIVE
Glucose, UA: NEGATIVE mg/dL
Ketones, ur: NEGATIVE mg/dL
Leukocytes,Ua: NEGATIVE
Nitrite: NEGATIVE
Protein, ur: NEGATIVE mg/dL
Specific Gravity, Urine: >1.046 — ABNORMAL HIGH (ref 1.005–1.030)
pH: 6 (ref 5.0–8.0)

## 2018-09-12 LAB — TYPE AND SCREEN
ABO/RH(D): O POS
Antibody Screen: NEGATIVE
Unit division: 0
Unit division: 0

## 2018-09-12 LAB — BPAM FFP
Blood Product Expiration Date: 202008082359
Blood Product Expiration Date: 202008082359
ISSUE DATE / TIME: 202007180425
ISSUE DATE / TIME: 202007180425
Unit Type and Rh: 600
Unit Type and Rh: 6200

## 2018-09-12 LAB — SARS CORONAVIRUS 2 BY RT PCR (HOSPITAL ORDER, PERFORMED IN ~~LOC~~ HOSPITAL LAB): SARS Coronavirus 2: NEGATIVE

## 2018-09-12 LAB — CDS SEROLOGY

## 2018-09-12 LAB — CBC
HCT: 44.5 % (ref 39.0–52.0)
Hemoglobin: 14.3 g/dL (ref 13.0–17.0)
MCH: 31 pg (ref 26.0–34.0)
MCHC: 32.1 g/dL (ref 30.0–36.0)
MCV: 96.3 fL (ref 80.0–100.0)
Platelets: 236 10*3/uL (ref 150–400)
RBC: 4.62 MIL/uL (ref 4.22–5.81)
RDW: 14.3 % (ref 11.5–15.5)
WBC: 12.6 10*3/uL — ABNORMAL HIGH (ref 4.0–10.5)
nRBC: 0 % (ref 0.0–0.2)

## 2018-09-12 LAB — ABO/RH: ABO/RH(D): O POS

## 2018-09-12 LAB — PROTIME-INR
INR: 1 (ref 0.8–1.2)
Prothrombin Time: 12.6 seconds (ref 11.4–15.2)

## 2018-09-12 LAB — CBG MONITORING, ED: Glucose-Capillary: 89 mg/dL (ref 70–99)

## 2018-09-12 LAB — ETHANOL: Alcohol, Ethyl (B): 179 mg/dL — ABNORMAL HIGH (ref <10)

## 2018-09-12 MED ORDER — THIAMINE HCL 100 MG/ML IJ SOLN: 100.0000 mg | Freq: Every day | INTRAMUSCULAR | Status: DC

## 2018-09-12 MED ORDER — FENTANYL CITRATE (PF) 100 MCG/2ML IJ SOLN: 50.0000 ug | Freq: Once | INTRAMUSCULAR | Status: DC

## 2018-09-12 MED ORDER — OXYCODONE HCL 5 MG PO TABS
10.0000 mg | ORAL_TABLET | Freq: Four times a day (QID) | ORAL | Status: DC | PRN
  Administered 2018-09-13 – 2018-09-14 (×3): 10 mg via ORAL
  Filled 2018-09-12 (×3): qty 2

## 2018-09-12 MED ORDER — HYDROMORPHONE HCL 1 MG/ML IJ SOLN
0.5000 mg | INTRAMUSCULAR | Status: DC | PRN
  Administered 2018-09-12: 0.5 mg via INTRAVENOUS
  Filled 2018-09-12: qty 1

## 2018-09-12 MED ORDER — ONDANSETRON 4 MG PO TBDP: 4.0000 mg | ORAL_TABLET | Freq: Four times a day (QID) | ORAL | Status: DC | PRN

## 2018-09-12 MED ORDER — ONDANSETRON HCL 4 MG/2ML IJ SOLN: 4.0000 mg | Freq: Once | INTRAMUSCULAR | Status: DC

## 2018-09-12 MED ORDER — ACETAMINOPHEN 325 MG PO TABS
650.0000 mg | ORAL_TABLET | Freq: Four times a day (QID) | ORAL | Status: DC
  Administered 2018-09-13 – 2018-09-14 (×5): 650 mg via ORAL
  Filled 2018-09-12 (×6): qty 2

## 2018-09-12 MED ORDER — SODIUM CHLORIDE 0.9 % IV BOLUS
1000.0000 mL | Freq: Once | INTRAVENOUS | Status: AC
  Administered 2018-09-12: 05:00:00 1000 mL via INTRAVENOUS

## 2018-09-12 MED ORDER — SODIUM CHLORIDE 0.9 % IV SOLN: INTRAVENOUS | Status: DC

## 2018-09-12 MED ORDER — SODIUM CHLORIDE 0.9 % IV BOLUS (SEPSIS)
1000.0000 mL | Freq: Once | INTRAVENOUS | Status: AC
  Administered 2018-09-12: 1000 mL via INTRAVENOUS

## 2018-09-12 MED ORDER — TETANUS-DIPHTH-ACELL PERTUSSIS 5-2.5-18.5 LF-MCG/0.5 IM SUSP
0.5000 mL | Freq: Once | INTRAMUSCULAR | Status: AC
  Administered 2018-09-12: 07:00:00 0.5 mL via INTRAMUSCULAR
  Filled 2018-09-12: qty 0.5

## 2018-09-12 MED ORDER — HYDRALAZINE HCL 20 MG/ML IJ SOLN: 10.0000 mg | INTRAMUSCULAR | Status: DC | PRN

## 2018-09-12 MED ORDER — VITAMIN B-1 100 MG PO TABS
100.0000 mg | ORAL_TABLET | Freq: Every day | ORAL | Status: DC
  Administered 2018-09-13 – 2018-09-14 (×2): 100 mg via ORAL
  Filled 2018-09-12 (×3): qty 1

## 2018-09-12 MED ORDER — LACTATED RINGERS IV BOLUS
1000.0000 mL | Freq: Once | INTRAVENOUS | Status: AC
  Administered 2018-09-12: 1000 mL via INTRAVENOUS

## 2018-09-12 MED ORDER — LORAZEPAM 1 MG PO TABS: 1.0000 mg | ORAL_TABLET | Freq: Four times a day (QID) | ORAL | Status: DC | PRN

## 2018-09-12 MED ORDER — ADULT MULTIVITAMIN W/MINERALS CH
1.0000 | ORAL_TABLET | Freq: Every day | ORAL | Status: DC
  Administered 2018-09-13 – 2018-09-14 (×2): 1 via ORAL
  Filled 2018-09-12 (×3): qty 1

## 2018-09-12 MED ORDER — LACTATED RINGERS IV SOLN
INTRAVENOUS | Status: DC
  Administered 2018-09-12 – 2018-09-14 (×6): via INTRAVENOUS

## 2018-09-12 MED ORDER — FOLIC ACID 1 MG PO TABS
1.0000 mg | ORAL_TABLET | Freq: Every day | ORAL | Status: DC
  Administered 2018-09-13 – 2018-09-14 (×2): 1 mg via ORAL
  Filled 2018-09-12 (×3): qty 1

## 2018-09-12 MED ORDER — ONDANSETRON HCL 4 MG/2ML IJ SOLN: 4.0000 mg | Freq: Four times a day (QID) | INTRAMUSCULAR | Status: DC | PRN

## 2018-09-12 MED ORDER — ENOXAPARIN SODIUM 40 MG/0.4ML ~~LOC~~ SOLN
40.0000 mg | SUBCUTANEOUS | Status: DC
  Administered 2018-09-12 – 2018-09-13 (×2): 40 mg via SUBCUTANEOUS
  Filled 2018-09-12 (×2): qty 0.4

## 2018-09-12 MED ORDER — LORAZEPAM 2 MG/ML IJ SOLN: 1.0000 mg | Freq: Four times a day (QID) | INTRAMUSCULAR | Status: DC | PRN

## 2018-09-12 MED ORDER — DOCUSATE SODIUM 100 MG PO CAPS
100.0000 mg | ORAL_CAPSULE | Freq: Two times a day (BID) | ORAL | Status: DC
  Administered 2018-09-13: 22:00:00 100 mg via ORAL
  Filled 2018-09-12 (×3): qty 1

## 2018-09-12 MED ORDER — IOHEXOL 300 MG/ML  SOLN
100.0000 mL | Freq: Once | INTRAMUSCULAR | Status: AC | PRN
  Administered 2018-09-12: 05:00:00 100 mL via INTRAVENOUS

## 2018-09-12 NOTE — Plan of Care (Signed)

## 2018-09-12 NOTE — ED Notes (Signed)
bp 96/64

## 2018-09-12 NOTE — ED Notes (Signed)
Neg fast

## 2018-09-12 NOTE — ED Notes (Signed)
Pt sleeping soundly.

## 2018-09-12 NOTE — ED Notes (Signed)
Clothes cut by ems placed in belongins bag  [employer] 23.25 money with keys and credit cards in security  Receipt with his ed chart
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
## 2018-09-12 NOTE — Progress Notes (Signed)
Orthopedic Tech Progress Note Patient Details:  Charles Wise. [birth year] [id number]  Ortho Devices Type of Ortho Device: Shoulder immobilizer, CAM walker Ortho Device/Splint Location: LRE & URE Ortho Device/Splint Interventions: Application, Ordered   Post Interventions Patient Tolerated: Well Instructions Provided: Care of device, Adjustment of device   Janit Pagan 09/12/2018, 7:52 AM

## 2018-09-12 NOTE — ED Notes (Signed)
P[t wounds washed with peroxide and saline  Wounds bleeding at present especially his mouth  He has a broken nose  The pt has been identified

## 2018-09-12 NOTE — ED Notes (Signed)
Condom cath placed.
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
## 2018-09-12 NOTE — ED Notes (Signed)
Portable chest abd and rt arm
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
## 2018-09-12 NOTE — ED Notes (Signed)
Dr white arrived at [place] will not let me place his name in the usual place on the staff of the level one
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
## 2018-09-12 NOTE — Progress Notes (Signed)
Orthopedic Tech Progress Note Patient Details:  Charles Wise. [birth year] [id number] Pt has cam walker and sling. Will be applied when pt wakes up or is admitted.  Patient ID: Charles Nair., male   DOB: February 10, 1971, 48 y.o.   MRN: [id number]   Melony Overly T 09/12/2018, 6:57 AM
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
## 2018-09-13 LAB — BASIC METABOLIC PANEL
Anion gap: 8 (ref 5–15)
BUN: 10 mg/dL (ref 6–20)
CO2: 22 mmol/L (ref 22–32)
Calcium: 8.6 mg/dL — ABNORMAL LOW (ref 8.9–10.3)
Chloride: 108 mmol/L (ref 98–111)
Creatinine, Ser: 0.83 mg/dL (ref 0.61–1.24)
GFR calc Af Amer: >60 mL/min (ref >60)
GFR calc non Af Amer: >60 mL/min (ref >60)
Glucose, Bld: 97 mg/dL (ref 70–99)
Potassium: 3.9 mmol/L (ref 3.5–5.1)
Sodium: 138 mmol/L (ref 135–145)

## 2018-09-13 LAB — HIV ANTIBODY (ROUTINE TESTING W REFLEX): HIV Screen 4th Generation wRfx: NONREACTIVE

## 2018-09-13 NOTE — Progress Notes (Signed)
Notified White MD of Charles Wise' sustained irregular heart rhythm, episodes of bradycardia (40's-50's), and a 16 beat run of asymptomatic V-tach.  White MD verbally ordered a 12-lead EKG.  EKG was performed with no significant findings to report.
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
## 2018-09-14 DIAGNOSIS — S82891A Other fracture of right lower leg, initial encounter for closed fracture: Secondary | ICD-10-CM

## 2018-09-14 DIAGNOSIS — S022XXA Fracture of nasal bones, initial encounter for closed fracture: Secondary | ICD-10-CM

## 2018-09-14 DIAGNOSIS — S42001A Fracture of unspecified part of right clavicle, initial encounter for closed fracture: Secondary | ICD-10-CM

## 2018-09-14 DIAGNOSIS — K0889 Other specified disorders of teeth and supporting structures: Secondary | ICD-10-CM

## 2018-09-14 DIAGNOSIS — F1010 Alcohol abuse, uncomplicated: Secondary | ICD-10-CM

## 2018-09-14 MED ORDER — OXYCODONE HCL 10 MG PO TABS: 10.0000 mg | ORAL_TABLET | Freq: Four times a day (QID) | ORAL | 0 refills | Status: DC | PRN

## 2018-09-14 MED ORDER — ACETAMINOPHEN 325 MG PO TABS: 650.0000 mg | ORAL_TABLET | Freq: Four times a day (QID) | ORAL | Status: DC | PRN

## 2018-09-14 MED ORDER — FOLIC ACID 1 MG PO TABS: 1.0000 mg | ORAL_TABLET | Freq: Every day | ORAL | Status: DC

## 2018-09-14 MED ORDER — DOCUSATE SODIUM 100 MG PO CAPS: 100.0000 mg | ORAL_CAPSULE | Freq: Two times a day (BID) | ORAL | 0 refills | Status: DC | PRN

## 2018-09-14 MED ORDER — THIAMINE HCL 100 MG PO TABS: 100.0000 mg | ORAL_TABLET | Freq: Every day | ORAL | Status: DC

## 2018-09-14 MED ORDER — ONDANSETRON HCL 4 MG/2ML IJ SOLN: 4.0000 mg | Freq: Once | INTRAMUSCULAR | 0 refills | Status: AC
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
## 2018-09-14 NOTE — Discharge Instructions (Signed)
Please follow up with a dentist for you loose tooth. I have provided you a referral to the dentist on call. You can also follow up with your dentist if you have one.   For the clavicle fracture, please wear your sling for comfort only. Please take your shoulder out of shoulder sling at least once per day (if not more) and perform shoulder range of motion exercises in order to prevent frozen shoulder. Please do not lift anything over 10 pounds for the next 6 weeks, or until cleared by orthopedics. They would like you to follow up in 2 weeks.   For the right medial malleolus ankle fracture, you are weightbearing as tolerated in a cam boot.  Please wear the cam boot while weightbearing for 1 month.  Again, Orthopedics would like you to follow up in 2 weeks.   Please call for follow up with Ear, Nose and Throat. They would like to see you in 1 week   Please follow instructions below for wound care on facial lacerations.   Please do not drive or operate heavy machinery while taking Narcotic pain medication (oxycodone)   Sterile Tape Wound Care Some cuts and wounds can be closed using sterile tape, also called skin adhesive strips. Skin adhesive strips can be used for shallow (superficial) and simple cuts, wounds, lacerations, and some surgical incisions. These strips act in place of stitches, or in addition to stitches, to hold the edges of the wound together to allow for better healing. Unlike stitches, the adhesive strips do not require needles or anesthetic medicine for placement. The strips usually fall off on their own as the wound is healing. It is important to take proper care of your wound at home while it heals. How to care for a sterile tape wound   Try to keep the area around your wound clean and dry. Do not allow the adhesive strips to get wet for the first 12 hours.  Do not use any soaps or ointments on the wound for the first 12 hours.  If a bandage (dressing) has been applied, keep  it dry.  Follow instructions from your health care provider about how often to change the dressing. ? Wash your hands with soap and water before you change your dressing. If soap and water are not available, use hand sanitizer. ? Change your dressing as told by your health care provider. ? Leave adhesive strips in place. These skin closures may need to stay in place for 2 weeks or longer. If adhesive strip edges start to loosen and curl up, you may trim the loose edges. Do not remove adhesive strips completely unless your health care provider tells you to do that.  Do not scratch, rub, or pick at the wound area.  Protect the wound from further injury until it is healed.  Protect the wound from sun and tanning bed exposure while it is healing, and for several weeks after healing.  Check the wound every day for signs of infection. Check for: ? More redness, swelling, or pain. ? More fluid or blood. ? Warmth. ? Pus or a bad smell. Follow these instructions at home:  Take over-the-counter and prescription medicines only as told by your health care provider.  Keep all follow-up visits as told by your health care provider. This is important. Contact a health care provider if:  Your adhesive strips become soaked with blood or fall off before the wound has healed. The tape will need to be replaced.  You have a fever. Get help right away if:  You have chills.  You develop a rash after the strips are applied.  You have a red streak that goes away from the wound.  You have more redness, swelling, or pain around your wound.  You have more fluid or blood coming from your wound.  Your wound feels warm to the touch.  You have pus or a bad smell coming from your wound.  Your wound breaks open. This information is not intended to replace advice given to you by your health care provider. Make sure you discuss any questions you have with your health care provider. Document Released:  03/21/2004 Document Revised: 01/24/2017 Document Reviewed: 01/05/2016 Elsevier Patient Education  2020 [employer].
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
## 2018-09-15 ENCOUNTER — Telehealth (HOSPITAL_COMMUNITY): Payer: Self-pay

## 2018-09-15 ENCOUNTER — Ambulatory Visit: Payer: BC Managed Care – PPO | Admitting: Critical Care Medicine

## 2018-09-15 NOTE — Telephone Encounter (Signed)
Patient tired to pick up Rx and the pharmacist told the patient to have the provider to call the insurance company to see if the medication will be covered. This is according to the patient.   Please call Charles Wise at [phone]  Thank you, Traci  [phone]

## 2018-09-16 ENCOUNTER — Encounter: Payer: Self-pay | Admitting: Critical Care Medicine

## 2018-09-20 ENCOUNTER — Encounter (HOSPITAL_COMMUNITY): Payer: Self-pay | Admitting: Emergency Medicine

## 2018-09-20 ENCOUNTER — Other Ambulatory Visit: Payer: Self-pay

## 2018-09-20 ENCOUNTER — Emergency Department (HOSPITAL_COMMUNITY)
Admission: EM | Admit: 2018-09-20 | Discharge: 2018-09-21 | Disposition: A | Discharge status: Home / Self Care | Payer: BC Managed Care – PPO | Attending: Emergency Medicine | Admitting: Emergency Medicine

## 2018-09-20 DIAGNOSIS — G44319 Acute post-traumatic headache, not intractable: Secondary | ICD-10-CM | POA: Insufficient documentation

## 2018-09-20 DIAGNOSIS — R51 Headache: Secondary | ICD-10-CM | POA: Diagnosis not present

## 2018-09-20 DIAGNOSIS — F17210 Nicotine dependence, cigarettes, uncomplicated: Secondary | ICD-10-CM | POA: Diagnosis not present
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
## 2018-09-21 MED ORDER — METOCLOPRAMIDE HCL 5 MG/ML IJ SOLN
10.0000 mg | Freq: Once | INTRAMUSCULAR | Status: AC
  Administered 2018-09-21: 10 mg via INTRAMUSCULAR
  Filled 2018-09-21: qty 2

## 2018-09-21 MED ORDER — KETOROLAC TROMETHAMINE 60 MG/2ML IM SOLN
30.0000 mg | Freq: Once | INTRAMUSCULAR | Status: AC
  Administered 2018-09-21: 30 mg via INTRAMUSCULAR
  Filled 2018-09-21: qty 2

## 2018-09-21 MED ORDER — DEXAMETHASONE SODIUM PHOSPHATE 10 MG/ML IJ SOLN
10.0000 mg | Freq: Once | INTRAMUSCULAR | Status: AC
  Administered 2018-09-21: 10 mg via INTRAMUSCULAR
  Filled 2018-09-21: qty 1

## 2018-09-21 NOTE — ED Notes (Signed)
Patient verbalizes understanding of discharge instructions. Opportunity for questioning and answers were provided. Armband removed by staff, pt discharged from ED in wheelchair.  
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
## 2018-09-21 NOTE — Discharge Instructions (Signed)
We recommend the use of over-the-counter Aleve for management of any persistent headaches.  Try to avoid the use of oxycodone for headache management as this can cause rebound headaches.  Continue follow-up with your specialists as scheduled.  You would also benefit from follow-up with a primary care doctor.  Return to the ED for any new or concerning symptoms.

## 2018-09-24 DIAGNOSIS — S022XXD Fracture of nasal bones, subsequent encounter for fracture with routine healing: Secondary | ICD-10-CM | POA: Diagnosis not present

## 2018-09-28 DIAGNOSIS — S42001A Fracture of unspecified part of right clavicle, initial encounter for closed fracture: Secondary | ICD-10-CM | POA: Diagnosis not present

## 2018-09-28 DIAGNOSIS — S8253XA Displaced fracture of medial malleolus of unspecified tibia, initial encounter for closed fracture: Secondary | ICD-10-CM | POA: Insufficient documentation

## 2018-09-28 DIAGNOSIS — S8254XA Nondisplaced fracture of medial malleolus of right tibia, initial encounter for closed fracture: Secondary | ICD-10-CM | POA: Diagnosis not present

## 2018-09-30 DIAGNOSIS — S8254XD Nondisplaced fracture of medial malleolus of right tibia, subsequent encounter for closed fracture with routine healing: Secondary | ICD-10-CM | POA: Diagnosis not present

## 2018-09-30 DIAGNOSIS — S42017D Nondisplaced fracture of sternal end of right clavicle, subsequent encounter for fracture with routine healing: Secondary | ICD-10-CM | POA: Diagnosis not present

## 2018-10-09 DIAGNOSIS — S42017D Nondisplaced fracture of sternal end of right clavicle, subsequent encounter for fracture with routine healing: Secondary | ICD-10-CM | POA: Diagnosis not present

## 2018-10-09 DIAGNOSIS — S8254XD Nondisplaced fracture of medial malleolus of right tibia, subsequent encounter for closed fracture with routine healing: Secondary | ICD-10-CM | POA: Diagnosis not present

## 2018-10-12 DIAGNOSIS — S42001A Fracture of unspecified part of right clavicle, initial encounter for closed fracture: Secondary | ICD-10-CM | POA: Diagnosis not present

## 2018-10-12 DIAGNOSIS — S8254XA Nondisplaced fracture of medial malleolus of right tibia, initial encounter for closed fracture: Secondary | ICD-10-CM | POA: Diagnosis not present

## 2018-10-14 DIAGNOSIS — S8254XD Nondisplaced fracture of medial malleolus of right tibia, subsequent encounter for closed fracture with routine healing: Secondary | ICD-10-CM | POA: Diagnosis not present

## 2018-10-14 DIAGNOSIS — S42017D Nondisplaced fracture of sternal end of right clavicle, subsequent encounter for fracture with routine healing: Secondary | ICD-10-CM | POA: Diagnosis not present

## 2018-10-20 ENCOUNTER — Other Ambulatory Visit: Payer: Self-pay

## 2018-10-20 ENCOUNTER — Encounter: Payer: Self-pay | Admitting: Nurse Practitioner

## 2018-10-20 ENCOUNTER — Ambulatory Visit: Payer: BC Managed Care – PPO | Attending: Nurse Practitioner | Admitting: Nurse Practitioner

## 2018-10-20 DIAGNOSIS — N5089 Other specified disorders of the male genital organs: Secondary | ICD-10-CM | POA: Diagnosis not present

## 2018-10-20 DIAGNOSIS — R3911 Hesitancy of micturition: Secondary | ICD-10-CM
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
## 2018-10-21 DIAGNOSIS — S8254XD Nondisplaced fracture of medial malleolus of right tibia, subsequent encounter for closed fracture with routine healing: Secondary | ICD-10-CM | POA: Diagnosis not present

## 2018-10-21 DIAGNOSIS — S42017D Nondisplaced fracture of sternal end of right clavicle, subsequent encounter for fracture with routine healing: Secondary | ICD-10-CM | POA: Diagnosis not present

## 2018-10-25 ENCOUNTER — Encounter: Payer: Self-pay | Admitting: Nurse Practitioner
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
## 2018-11-03 ENCOUNTER — Encounter: Payer: Self-pay | Admitting: Critical Care Medicine

## 2018-11-03 ENCOUNTER — Other Ambulatory Visit: Payer: Self-pay

## 2018-11-03 ENCOUNTER — Ambulatory Visit: Payer: BC Managed Care – PPO | Attending: Critical Care Medicine | Admitting: Critical Care Medicine

## 2018-11-03 VITALS — BP 117/75 | HR 74 | Temp 98.6°F | Ht 66.0 in | Wt 177.2 lb

## 2018-11-03 DIAGNOSIS — Z87828 Personal history of other (healed) physical injury and trauma: Secondary | ICD-10-CM

## 2018-11-03 DIAGNOSIS — Z23 Encounter for immunization: Secondary | ICD-10-CM | POA: Diagnosis not present

## 2018-11-03 DIAGNOSIS — F17210 Nicotine dependence, cigarettes, uncomplicated: Secondary | ICD-10-CM | POA: Diagnosis not present

## 2018-11-03 DIAGNOSIS — F1010 Alcohol abuse, uncomplicated: Secondary | ICD-10-CM | POA: Diagnosis not present

## 2018-11-03 DIAGNOSIS — N400 Enlarged prostate without lower urinary tract symptoms: Secondary | ICD-10-CM

## 2018-11-03 DIAGNOSIS — S82891D Other fracture of right lower leg, subsequent encounter for closed fracture with routine healing: Secondary | ICD-10-CM

## 2018-11-03 DIAGNOSIS — S42001D Fracture of unspecified part of right clavicle, subsequent encounter for fracture with routine healing: Secondary | ICD-10-CM

## 2018-11-03 DIAGNOSIS — S022XXD Fracture of nasal bones, subsequent encounter for fracture with routine healing: Secondary | ICD-10-CM

## 2018-11-03 DIAGNOSIS — F17200 Nicotine dependence, unspecified, uncomplicated: Secondary | ICD-10-CM

## 2018-11-03 MED ORDER — TAMSULOSIN HCL 0.4 MG PO CAPS: 0.4000 mg | ORAL_CAPSULE | Freq: Every day | ORAL | 3 refills | Status: DC

## 2018-11-03 NOTE — Assessment & Plan Note (Signed)
Close fracture of the nasal bones rapidly resolving
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
## 2018-11-03 NOTE — Assessment & Plan Note (Signed)
History of ethanol use the patient did have elevated EtOH and was intoxicated when he was brought into the hospital the patient denies this but I explained to him that the levels were elevated in the system
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
## 2018-11-03 NOTE — Assessment & Plan Note (Signed)
Current smoker  Smoking cessation advised

## 2018-11-03 NOTE — Assessment & Plan Note (Signed)
Slightly enlarged prostate on current exam and improvement on tamsulosin with normal PSA  Continue tamsulosin

## 2018-11-03 NOTE — Assessment & Plan Note (Signed)
Right clavicle fracture also improving no longer requiring a sling

## 2018-11-03 NOTE — Patient Instructions (Signed)
No change in medications and refills on your tamsulosin were sent to your [place]  A flu shot was given  Your labs were printed out and given to you  Continue to work on smoking cessation  Return to see Geryl Rankins your primary care provider in 6 months

## 2018-11-12 DIAGNOSIS — S8254XA Nondisplaced fracture of medial malleolus of right tibia, initial encounter for closed fracture: Secondary | ICD-10-CM | POA: Diagnosis not present

## 2018-11-12 DIAGNOSIS — S42001A Fracture of unspecified part of right clavicle, initial encounter for closed fracture: Secondary | ICD-10-CM | POA: Diagnosis not present

## 2019-01-02 ENCOUNTER — Ambulatory Visit (HOSPITAL_COMMUNITY)
Admission: EM | Admit: 2019-01-02 | Discharge: 2019-01-02 | Disposition: A | Discharge status: Home / Self Care | Payer: BC Managed Care – PPO | Attending: Family Medicine | Admitting: Family Medicine

## 2019-01-02 ENCOUNTER — Encounter (HOSPITAL_COMMUNITY): Payer: Self-pay | Admitting: Emergency Medicine

## 2019-01-02 ENCOUNTER — Other Ambulatory Visit: Payer: Self-pay

## 2019-01-02 DIAGNOSIS — Z202 Contact with and (suspected) exposure to infections with a predominantly sexual mode of transmission: Secondary | ICD-10-CM | POA: Diagnosis not present

## 2019-01-02 DIAGNOSIS — Z113 Encounter for screening for infections with a predominantly sexual mode of transmission: Secondary | ICD-10-CM

## 2019-01-02 LAB — HIV ANTIBODY (ROUTINE TESTING W REFLEX): HIV Screen 4th Generation wRfx: NONREACTIVE
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
## 2019-01-02 NOTE — Discharge Instructions (Signed)
We have sent testing for sexually transmitted infections. We will notify you of any positive results once they are received. If required, we will prescribe any medications you might need. °

## 2019-01-03 LAB — RPR: RPR Ser Ql: NONREACTIVE

## 2019-01-04 NOTE — ED Provider Notes (Signed)
[name]   [id number] 01/02/19 Arrival Time: [id number]  ASSESSMENT & PLAN:  1. Possible exposure to STD     Declines any empiric treatment.   Discharge Instructions     We have sent testing for sexually transmitted infections. We will notify you of any positive results once they are received. If required, we will prescribe any medications you might need.      Pending: Labs Reviewed  HIV ANTIBODY (ROUTINE TESTING W REFLEX)  RPR  CYTOLOGY, (ORAL, ANAL, URETHRAL) ANCILLARY ONLY    Will notify of any positive results. Instructed to refrain from sexual activity for at least seven days.  Reviewed expectations re: course of current medical issues. Questions answered. Outlined signs and symptoms indicating need for more acute intervention. Patient verbalized understanding. After Visit Summary given.   SUBJECTIVE:  Charles Wise. is a 48 y.o. male who requests possible STD exposure. "Girlfriend said I had something." Reports no penile discharge. No urinary symptoms including dysuria. Afebrile. No abdominal or pelvic pain. No n/v. No rashes or lesions. Reports that he is sexually active with single male partner. History of STI: none reported.  ROS: As per HPI. All other systems negative.   OBJECTIVE:  Vitals:   01/02/19 1614  BP: 123/86  Pulse: 80  Resp: 18  Temp: 98.5 F (36.9 C)  TempSrc: Oral  SpO2: 97%     General appearance: alert, cooperative, appears stated age and no distress Throat: lips, mucosa, and tongue normal; teeth and gums normal CV: RRR Lungs: CTAB Back: no CVA tenderness; FROM at waist Abdomen: soft, non-tender GU: normal appearing genitalia Skin: warm and dry Psychological: alert and cooperative; normal mood and affect.    Labs Reviewed  HIV ANTIBODY (ROUTINE TESTING W REFLEX)  RPR  CYTOLOGY, (ORAL, ANAL, URETHRAL) ANCILLARY ONLY    No Known Allergies  Past Medical History:  Diagnosis Date  . Acute renal failure  ([place]) 09/07/2014  . Headache   . High cholesterol   . 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 Renal failure 08/2014   resolved   Family History  Problem Relation Age of Onset  . Heart failure Father   . Stroke Other   . Cancer Neg Hx   . Diabetes Neg Hx   . Heart disease Neg Hx   . Hypertension Neg Hx    Social History   Socioeconomic History  . Marital status: Single    Spouse name: Not on file  . Number of children: 4  . Years of education: 59  . Highest education level: Not on file  Occupational History  . Occupation: [employer]    Comment: heavy lifting required   . Occupation: Environmental health practitioner    Comment: second job   Social Needs  . Financial resource strain: Not on file  . Food insecurity    Worry: Not on file    Inability: Not on file  . Transportation needs    Medical: Not on file    Non-medical: Not on file  Tobacco Use  . Smoking status: Current Every Day Smoker    Packs/day: 0.25    Years: 30.00    Pack years: 7.50    Types: Cigarettes  . Smokeless tobacco: Never Used  Substance and Sexual Activity  . Alcohol use: Not Currently    Comment: rare   . Drug use: No  . Sexual activity: Yes    Birth control/protection: Condom  Lifestyle  . Physical activity    Days per  week: Not on file    Minutes per session: Not on file  . Stress: Not on file  Relationships  . Social Herbalist on phone: Not on file    Gets together: Not on file    Attends religious service: Not on file    Active member of club or organization: Not on file    Attends meetings of clubs or organizations: Not on file    Relationship status: Not on file  . Intimate partner violence    Fear of current or ex partner: Not on file    Emotionally abused: Not on file    Physically abused: Not on file    Forced sexual activity: Not on file  Other Topics Concern  . Not on file  Social History Narrative   ** Merged History Encounter **       Lives alone  4 children 3 boys  and 1 girl Mom nearby and supportive           Charles Kick, MD 01/04/19 [phone]

## 2019-01-05 LAB — CYTOLOGY, (ORAL, ANAL, URETHRAL) ANCILLARY ONLY
Chlamydia: NEGATIVE
Neisseria Gonorrhea: NEGATIVE
Trichomonas: NEGATIVE

## 2019-01-06 DIAGNOSIS — F330 Major depressive disorder, recurrent, mild: Secondary | ICD-10-CM | POA: Diagnosis not present

## 2019-01-06 DIAGNOSIS — F4320 Adjustment disorder, unspecified: Secondary | ICD-10-CM | POA: Diagnosis not present

## 2019-01-13 ENCOUNTER — Telehealth (HOSPITAL_COMMUNITY): Payer: Self-pay | Admitting: Emergency Medicine

## 2019-01-13 NOTE — Telephone Encounter (Signed)
Pt called requesting test results for stds. Results reported as negative.

## 2019-01-18 DIAGNOSIS — F330 Major depressive disorder, recurrent, mild: Secondary | ICD-10-CM | POA: Diagnosis not present

## 2019-01-19 ENCOUNTER — Other Ambulatory Visit: Payer: Self-pay

## 2019-01-19 ENCOUNTER — Ambulatory Visit: Payer: BC Managed Care – PPO | Attending: Nurse Practitioner | Admitting: Nurse Practitioner

## 2019-01-19 ENCOUNTER — Encounter: Payer: Self-pay | Admitting: Nurse Practitioner

## 2019-01-19 DIAGNOSIS — N401 Enlarged prostate with lower urinary tract symptoms: Secondary | ICD-10-CM

## 2019-01-19 DIAGNOSIS — F17200 Nicotine dependence, unspecified, uncomplicated: Secondary | ICD-10-CM

## 2019-01-19 DIAGNOSIS — F17210 Nicotine dependence, cigarettes, uncomplicated: Secondary | ICD-10-CM | POA: Diagnosis not present

## 2019-01-19 DIAGNOSIS — N400 Enlarged prostate without lower urinary tract symptoms: Secondary | ICD-10-CM

## 2019-01-19 DIAGNOSIS — R3911 Hesitancy of micturition: Secondary | ICD-10-CM
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
## 2019-03-05 ENCOUNTER — Encounter: Payer: BC Managed Care – PPO | Admitting: Nurse Practitioner

## 2019-08-29 ENCOUNTER — Emergency Department (HOSPITAL_COMMUNITY)
Admission: EM | Admit: 2019-08-29 | Discharge: 2019-08-29 | Disposition: A | Discharge status: Home / Self Care | Payer: Managed Care, Other (non HMO) | Attending: Emergency Medicine | Admitting: Emergency Medicine

## 2019-08-29 ENCOUNTER — Encounter (HOSPITAL_COMMUNITY): Payer: Self-pay

## 2019-08-29 ENCOUNTER — Other Ambulatory Visit: Payer: Self-pay

## 2019-08-29 DIAGNOSIS — F17210 Nicotine dependence, cigarettes, uncomplicated: Secondary | ICD-10-CM | POA: Insufficient documentation

## 2019-08-29 DIAGNOSIS — H5712 Ocular pain, left eye: Secondary | ICD-10-CM | POA: Diagnosis present

## 2019-08-29 DIAGNOSIS — Z79899 Other long term (current) drug therapy: Secondary | ICD-10-CM | POA: Insufficient documentation

## 2019-08-29 DIAGNOSIS — H00025 Hordeolum internum left lower eyelid: Secondary | ICD-10-CM | POA: Diagnosis not present

## 2019-08-29 MED ORDER — ERYTHROMYCIN 5 MG/GM OP OINT
1.0000 "application " | TOPICAL_OINTMENT | Freq: Once | OPHTHALMIC | Status: AC
  Administered 2019-08-29: 1 via OPHTHALMIC
  Filled 2019-08-29: qty 3.5

## 2019-08-29 MED ORDER — HYPROMELLOSE (GONIOSCOPIC) 2.5 % OP SOLN: 1.0000 [drp] | Freq: Four times a day (QID) | OPHTHALMIC | 12 refills | Status: DC | PRN

## 2019-08-29 NOTE — ED Triage Notes (Signed)
Abscess in left eye.

## 2019-08-29 NOTE — Discharge Instructions (Addendum)
Apply a small amount of ointment morning and evening x7 days.  Use warm compresses and while in the shower massage the lower eyelid towards the nose.
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
## 2020-10-10 ENCOUNTER — Encounter (HOSPITAL_COMMUNITY): Payer: Self-pay | Admitting: *Deleted

## 2020-10-10 ENCOUNTER — Other Ambulatory Visit: Payer: Self-pay

## 2020-10-10 ENCOUNTER — Ambulatory Visit (HOSPITAL_COMMUNITY)
Admission: EM | Admit: 2020-10-10 | Discharge: 2020-10-10 | Disposition: A | Discharge status: Home / Self Care | Payer: Managed Care, Other (non HMO) | Attending: Family Medicine | Admitting: Family Medicine

## 2020-10-10 DIAGNOSIS — G5702 Lesion of sciatic nerve, left lower limb: Secondary | ICD-10-CM | POA: Diagnosis not present

## 2020-10-10 MED ORDER — DEXAMETHASONE SODIUM PHOSPHATE 10 MG/ML IJ SOLN
INTRAMUSCULAR | Status: AC
  Filled 2020-10-10: qty 1

## 2020-10-10 MED ORDER — DEXAMETHASONE SODIUM PHOSPHATE 10 MG/ML IJ SOLN
10.0000 mg | Freq: Once | INTRAMUSCULAR | Status: AC
  Administered 2020-10-10: 10 mg via INTRAMUSCULAR
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
## 2020-10-10 NOTE — ED Triage Notes (Signed)
Pt reports back pain since yesterday
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
## 2021-08-18 ENCOUNTER — Encounter: Payer: Self-pay | Admitting: Emergency Medicine

## 2021-08-18 ENCOUNTER — Ambulatory Visit
Admission: EM | Admit: 2021-08-18 | Discharge: 2021-08-19 | Disposition: A | Discharge status: Home / Self Care | Payer: 59 | Attending: Family Medicine | Admitting: Family Medicine

## 2021-08-18 DIAGNOSIS — H00015 Hordeolum externum left lower eyelid: Secondary | ICD-10-CM

## 2021-08-18 MED ORDER — GENTAMICIN SULFATE 0.3 % OP SOLN: 2.0000 [drp] | Freq: Three times a day (TID) | OPHTHALMIC | 0 refills | Status: AC

## 2021-08-18 MED ORDER — AMOXICILLIN-POT CLAVULANATE 875-125 MG PO TABS: 1.0000 | ORAL_TABLET | Freq: Two times a day (BID) | ORAL | 0 refills | Status: AC

## 2021-12-07 ENCOUNTER — Ambulatory Visit
Admission: RE | Admit: 2021-12-07 | Discharge: 2021-12-07 | Disposition: A | Discharge status: Home / Self Care | Payer: Commercial Managed Care - HMO | Source: Ambulatory Visit | Attending: Physician Assistant | Admitting: Physician Assistant

## 2021-12-07 VITALS — BP 115/74 | HR 71 | Temp 97.9°F | Resp 18

## 2021-12-07 DIAGNOSIS — Z113 Encounter for screening for infections with a predominantly sexual mode of transmission: Secondary | ICD-10-CM | POA: Diagnosis not present

## 2021-12-07 DIAGNOSIS — L0201 Cutaneous abscess of face: Secondary | ICD-10-CM | POA: Insufficient documentation

## 2021-12-07 MED ORDER — AMOXICILLIN-POT CLAVULANATE 875-125 MG PO TABS: 1.0000 | ORAL_TABLET | Freq: Two times a day (BID) | ORAL | 0 refills | Status: DC

## 2021-12-07 MED ORDER — CEFTRIAXONE SODIUM 1 G IJ SOLR
1.0000 g | Freq: Once | INTRAMUSCULAR | Status: AC
  Administered 2021-12-07: 1 g via INTRAMUSCULAR

## 2021-12-07 NOTE — Discharge Instructions (Signed)
We gave you an injection of antibiotics today.  Please start Augmentin twice daily for 10 days.  Use warm compresses on this area.  Please follow-up with ENT as soon as possible.  Call them to schedule appointment.  If anything changes please go to the emergency room where they have access to imaging and specialists and potentially drain this.  If you develop any fever, enlarging lesion, shortness of breath, muffled voice, nausea, vomiting, swelling of your throat you need to go to the ER immediately.  We will contact you with your lab work if anything is positive.  Please abstain from sex until you receive your results.  Use a condom with each sexual encounter.  If you develop any symptoms please return for reevaluation.
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
## 2021-12-07 NOTE — ED Triage Notes (Signed)
Pt presents to uc with co of swelling under chin. Pt reports he was shaving on his chin on Sunday and noticed a small cut but didn't think anything of it but as the week has gone on he has increased swelling and pain. Pt reports warm compresses. Pt also requesting for sti testing.

## 2021-12-07 NOTE — ED Notes (Signed)
Set up pcp for patient on October 27th 3:40 pm

## 2021-12-08 LAB — RPR: RPR Ser Ql: NONREACTIVE

## 2021-12-08 LAB — HIV ANTIBODY (ROUTINE TESTING W REFLEX): HIV Screen 4th Generation wRfx: NONREACTIVE

## 2021-12-10 ENCOUNTER — Telehealth: Payer: Self-pay | Admitting: Emergency Medicine

## 2021-12-10 LAB — CYTOLOGY, (ORAL, ANAL, URETHRAL) ANCILLARY ONLY
Chlamydia: NEGATIVE
Comment: NEGATIVE
Comment: NEGATIVE
Comment: NORMAL
Neisseria Gonorrhea: NEGATIVE
Trichomonas: NEGATIVE

## 2021-12-10 NOTE — Telephone Encounter (Signed)
Spoke with patient regarding lab results. Explained to patient that two out of three results were back and negative. Explained to patient that cytology should be back hopefully tomorrow

## 2021-12-11 ENCOUNTER — Telehealth: Payer: Self-pay

## 2021-12-21 ENCOUNTER — Ambulatory Visit: Payer: Commercial Managed Care - HMO | Admitting: Family Medicine

## 2022-01-08 ENCOUNTER — Ambulatory Visit: Payer: Commercial Managed Care - HMO | Admitting: Family Medicine

## 2022-01-08 ENCOUNTER — Telehealth: Payer: Self-pay | Admitting: Nurse Practitioner

## 2022-01-08 NOTE — Telephone Encounter (Signed)
Pt was a no show for a NP app with  Dr. Ethelene Hal on 01/08/22, I sent a letter.

## 2022-01-22 NOTE — Telephone Encounter (Signed)
2nd NP no show, dismissal letter sent, fee generated

## 2022-01-28 ENCOUNTER — Encounter (HOSPITAL_COMMUNITY): Payer: Self-pay | Admitting: Emergency Medicine

## 2022-01-28 ENCOUNTER — Ambulatory Visit (HOSPITAL_COMMUNITY)
Admission: EM | Admit: 2022-01-28 | Discharge: 2022-01-28 | Disposition: A | Discharge status: Home / Self Care | Payer: Commercial Managed Care - HMO | Attending: Family | Admitting: Family

## 2022-01-28 DIAGNOSIS — R220 Localized swelling, mass and lump, head: Secondary | ICD-10-CM

## 2022-01-28 DIAGNOSIS — H00014 Hordeolum externum left upper eyelid: Secondary | ICD-10-CM | POA: Diagnosis not present

## 2022-01-28 DIAGNOSIS — H1033 Unspecified acute conjunctivitis, bilateral: Secondary | ICD-10-CM | POA: Diagnosis not present

## 2022-01-28 DIAGNOSIS — H00024 Hordeolum internum left upper eyelid: Secondary | ICD-10-CM

## 2022-01-28 MED ORDER — CIPROFLOXACIN HCL 0.3 % OP SOLN: 2.0000 [drp] | OPHTHALMIC | 0 refills | Status: DC

## 2022-01-28 MED ORDER — CEPHALEXIN 500 MG PO CAPS: 500.0000 mg | ORAL_CAPSULE | Freq: Three times a day (TID) | ORAL | 0 refills | Status: AC
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
## 2022-01-28 NOTE — ED Triage Notes (Signed)
Noticed a funny feeling around his eye one week ago, pain/swelling worsened Wednesday. Continued to worsen over the weekend. Reports a similar event in his right eye recently, which he received oral antibiotics and drops for. Has been treating current eye problem at home with previously prescribed gentamicin drops. Denies fever, visual changes. Eye pain with upward motions

## 2022-01-28 NOTE — Discharge Instructions (Signed)
Recommend start Keflex '500mg'$  3 times a day for 10 days for infection. Also use Cipro eye drops- 2 drops in both eyes every 4 hours while awake for 7 days. May continue to apply warm compresses to area for comfort. If eye symptoms gets worse, go to the ER ASAP. Otherwise, follow-up with the Eye doctor as recommended.

## 2022-03-23 ENCOUNTER — Emergency Department (HOSPITAL_COMMUNITY): Payer: Commercial Managed Care - HMO

## 2022-03-23 ENCOUNTER — Emergency Department (HOSPITAL_COMMUNITY)
Admission: EM | Admit: 2022-03-23 | Discharge: 2022-03-23 | Disposition: A | Discharge status: Home / Self Care | Payer: Commercial Managed Care - HMO | Attending: Emergency Medicine | Admitting: Emergency Medicine

## 2022-03-23 DIAGNOSIS — S161XXA Strain of muscle, fascia and tendon at neck level, initial encounter: Secondary | ICD-10-CM | POA: Diagnosis not present

## 2022-03-23 DIAGNOSIS — M25511 Pain in right shoulder: Secondary | ICD-10-CM | POA: Insufficient documentation

## 2022-03-23 DIAGNOSIS — M542 Cervicalgia: Secondary | ICD-10-CM | POA: Diagnosis present

## 2022-03-23 MED ORDER — HYDROCODONE-ACETAMINOPHEN 5-325 MG PO TABS
2.0000 | ORAL_TABLET | ORAL | 0 refills | Status: DC | PRN
Start: 2022-03-23 — End: 2023-10-13

## 2022-03-23 MED ORDER — NAPROXEN 375 MG PO TABS: 375.0000 mg | ORAL_TABLET | Freq: Two times a day (BID) | ORAL | 0 refills | Status: DC

## 2022-03-23 MED ORDER — METHOCARBAMOL 500 MG PO TABS: 500.0000 mg | ORAL_TABLET | Freq: Three times a day (TID) | ORAL | 0 refills | Status: DC | PRN

## 2022-03-23 MED ORDER — HYDROCODONE-ACETAMINOPHEN 5-325 MG PO TABS
2.0000 | ORAL_TABLET | ORAL | 0 refills | Status: DC | PRN
Start: 2022-03-23 — End: 2022-03-23

## 2022-03-23 MED ORDER — HYDROCODONE-ACETAMINOPHEN 5-325 MG PO TABS
2.0000 | ORAL_TABLET | Freq: Once | ORAL | Status: AC
  Administered 2022-03-23: 2 via ORAL
  Filled 2022-03-23: qty 2

## 2022-03-23 MED ORDER — ONDANSETRON 4 MG PO TBDP
4.0000 mg | ORAL_TABLET | Freq: Once | ORAL | Status: AC
  Administered 2022-03-23: 4 mg via ORAL
  Filled 2022-03-23: qty 1

## 2022-03-23 NOTE — ED Provider Notes (Signed)
Patient was handed off from [place], [place]. Plan was to wait for MRI results prior to dispo. Physical Exam  BP 121/86 (BP Location: Left Arm)   Pulse 62   Temp 98.2 F (36.8 C) (Oral)   Resp 16   SpO2 100%   Physical Exam Vitals and nursing note reviewed.  Constitutional:      Appearance: Normal appearance.  HENT:     Head: Normocephalic and atraumatic.  Eyes:     Conjunctiva/sclera: Conjunctivae normal.  Neck:     Comments: Patient wearing C-collar on exam Cardiovascular:     Rate and Rhythm: Normal rate and regular rhythm.     Pulses: Normal pulses.     Heart sounds: Normal heart sounds.  Pulmonary:     Effort: Pulmonary effort is normal.     Breath sounds: Normal breath sounds.  Musculoskeletal:     Cervical back: Tenderness present.  Skin:    General: Skin is warm and dry.     Capillary Refill: Capillary refill takes less than 2 seconds.  Neurological:     Mental Status: He is alert.     Procedures  Procedures  ED Course / MDM   Clinical Course as of 03/23/22 1906  Sat Mar 23, 2022  1451 CT Cervical Spine Wo Contrast I visualized and interpreted the CT C-spine which shows no evidence of fracture however patient has pain, paresthesia and equivocal weakness of the arm..  Given these findings I have in shared decision-making with the patient decided to order MR C-spine. [AH]    Clinical Course User Index [AH] Margarita Mail, PA-C   Medical Decision Making Amount and/or Complexity of Data Reviewed Radiology: ordered. Decision-making details documented in ED Course.  Risk Prescription drug management.   Patient was handed off from [place], [place]. 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 Advised patient that he is safe to discharge home and take medications as prescribed. Educated on return precautions.  Patient agreeable with plan to discharge home.       Luvenia Heller, PA-C 03/23/22 1910    Valarie Merino, MD 03/23/22 [phone]
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
## 2022-03-23 NOTE — Discharge Instructions (Signed)
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
## 2023-04-04 ENCOUNTER — Encounter: Payer: Self-pay | Admitting: Emergency Medicine

## 2023-04-04 ENCOUNTER — Ambulatory Visit
Admission: EM | Admit: 2023-04-04 | Discharge: 2023-04-04 | Disposition: A | Discharge status: Home / Self Care | Payer: Managed Care, Other (non HMO) | Attending: Internal Medicine | Admitting: Internal Medicine

## 2023-04-04 DIAGNOSIS — J069 Acute upper respiratory infection, unspecified: Secondary | ICD-10-CM

## 2023-04-04 DIAGNOSIS — R197 Diarrhea, unspecified: Secondary | ICD-10-CM

## 2023-04-04 LAB — POCT INFLUENZA A/B
Influenza A, POC: NEGATIVE
Influenza B, POC: NEGATIVE

## 2023-04-04 NOTE — Discharge Instructions (Addendum)
 You may take Pepto as directed in the box if the diarrhea is bothersome. You may continue with current cold medications
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
## 2023-04-04 NOTE — ED Triage Notes (Signed)
 Pt presents with sore throat, cough, diarrhea, and headache as symptoms that onset today. Pt states no fever that he is aware of.

## 2023-05-22 ENCOUNTER — Ambulatory Visit: Admission: EM | Admit: 2023-05-22 | Discharge: 2023-05-22 | Disposition: A | Discharge status: Home / Self Care

## 2023-05-22 ENCOUNTER — Encounter: Payer: Self-pay | Admitting: Emergency Medicine

## 2023-05-22 DIAGNOSIS — H00011 Hordeolum externum right upper eyelid: Secondary | ICD-10-CM

## 2023-05-22 MED ORDER — ERYTHROMYCIN 5 MG/GM OP OINT: TOPICAL_OINTMENT | OPHTHALMIC | 0 refills | Status: DC

## 2023-05-22 NOTE — Discharge Instructions (Signed)
 You have a stye.  I have prescribed antibiotic ointment.  Follow-up with the eye doctor.  Use warm compresses as we discussed.
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
## 2023-08-13 ENCOUNTER — Emergency Department (HOSPITAL_COMMUNITY)
Admission: EM | Admit: 2023-08-13 | Discharge: 2023-08-13 | Disposition: A | Discharge status: Home / Self Care | Attending: Emergency Medicine | Admitting: Emergency Medicine

## 2023-08-13 ENCOUNTER — Emergency Department (HOSPITAL_COMMUNITY)

## 2023-08-13 ENCOUNTER — Encounter (HOSPITAL_COMMUNITY): Payer: Self-pay

## 2023-08-13 DIAGNOSIS — M1712 Unilateral primary osteoarthritis, left knee: Secondary | ICD-10-CM | POA: Diagnosis not present

## 2023-08-13 DIAGNOSIS — R0789 Other chest pain: Secondary | ICD-10-CM | POA: Diagnosis present

## 2023-08-13 DIAGNOSIS — M79662 Pain in left lower leg: Secondary | ICD-10-CM

## 2023-08-13 DIAGNOSIS — Z720 Tobacco use: Secondary | ICD-10-CM | POA: Insufficient documentation

## 2023-08-13 LAB — BASIC METABOLIC PANEL WITH GFR
Anion gap: 8 (ref 5–15)
BUN: 11 mg/dL (ref 6–20)
CO2: 21 mmol/L — ABNORMAL LOW (ref 22–32)
Calcium: 8.6 mg/dL — ABNORMAL LOW (ref 8.9–10.3)
Chloride: 110 mmol/L (ref 98–111)
Creatinine, Ser: 0.82 mg/dL (ref 0.61–1.24)
GFR, Estimated: >60 mL/min (ref >60)
Glucose, Bld: 68 mg/dL — ABNORMAL LOW (ref 70–99)
Potassium: 3.8 mmol/L (ref 3.5–5.1)
Sodium: 139 mmol/L (ref 135–145)

## 2023-08-13 LAB — CBC WITH DIFFERENTIAL/PLATELET
Abs Immature Granulocytes: 0.02 10*3/uL (ref 0.00–0.07)
Basophils Absolute: 0.1 10*3/uL (ref 0.0–0.1)
Basophils Relative: 1 %
Eosinophils Absolute: 0.4 10*3/uL (ref 0.0–0.5)
Eosinophils Relative: 4 %
HCT: 41.6 % (ref 39.0–52.0)
Hemoglobin: 13.7 g/dL (ref 13.0–17.0)
Immature Granulocytes: 0 %
Lymphocytes Relative: 25 %
Lymphs Abs: 2.5 10*3/uL (ref 0.7–4.0)
MCH: 31.5 pg (ref 26.0–34.0)
MCHC: 32.9 g/dL (ref 30.0–36.0)
MCV: 95.6 fL (ref 80.0–100.0)
Monocytes Absolute: 1.1 10*3/uL — ABNORMAL HIGH (ref 0.1–1.0)
Monocytes Relative: 11 %
Neutro Abs: 5.8 10*3/uL (ref 1.7–7.7)
Neutrophils Relative %: 59 %
Platelets: 214 10*3/uL (ref 150–400)
RBC: 4.35 MIL/uL (ref 4.22–5.81)
RDW: 15.1 % (ref 11.5–15.5)
WBC: 9.9 10*3/uL (ref 4.0–10.5)
nRBC: 0 % (ref 0.0–0.2)

## 2023-08-13 LAB — TROPONIN I (HIGH SENSITIVITY)
Troponin I (High Sensitivity): 2 ng/L (ref <18)
Troponin I (High Sensitivity): 2 ng/L (ref <18)

## 2023-08-13 LAB — CBG MONITORING, ED: Glucose-Capillary: 125 mg/dL — ABNORMAL HIGH (ref 70–99)

## 2023-08-13 MED ORDER — IBUPROFEN 200 MG PO TABS
400.0000 mg | ORAL_TABLET | Freq: Once | ORAL | Status: AC
  Administered 2023-08-13: 400 mg via ORAL
  Filled 2023-08-13: qty 2

## 2023-08-13 NOTE — ED Notes (Signed)
 Pt Cbg is 125

## 2023-08-13 NOTE — ED Triage Notes (Signed)
 Pt arrived reporting chest pain and dizziness that started this morning. States left side and radiates down to his leg. No other pain.

## 2023-08-13 NOTE — ED Notes (Signed)
 Pt wants a dr note for work please

## 2023-08-13 NOTE — Discharge Instructions (Signed)
 You were seen in the emergency department for your chest pain and your leg pain.  Your workup showed no signs of heart attack or stress on your heart or abnormalities within your lungs.  You likely pulled the muscles in your chest and can take Tylenol  and Motrin  every 6 hours as needed for pain.  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  You should follow-up with primary care to have your symptoms rechecked and for further management.  You can return to the emergency department for significantly worsening pain, severe shortness of breath, if you pass out or any other new or concerning symptoms.

## 2023-08-13 NOTE — Progress Notes (Signed)
 LLE venous duplex has been completed.  Preliminary results given to Dr. Nora Beal.   Results can be found under chart review under CV PROC. 08/13/2023 5:18 PM Prudie Guthridge RVT, RDMS

## 2023-08-13 NOTE — ED Provider Notes (Signed)
 [place] [facility]ERGENCY DEPARTMENT AT [facility] Provider Note   CSN: [id number] Arrival date & time: 08/13/23  1001     Patient presents with: Chest Pain   Charles Wise. is a 53 y.o. male.   Patient is a 53 year old male with a past medical history of tobacco use presenting to the emergency department with chest pain.  Patient states that he woke up this morning around 5 AM to get ready for work.  States that he had a mild headache.  He states that while he was getting ready for work he started to develop some left-sided chest pain.  He states it felt like a sharp and squeezing type of pain that radiated around to his left shoulder blade.  He states that he tried to lay down and went back to sleep and when he woke up the pain was still there and started radiating down his left side to the back of his left leg and knee.  He states that he initially felt short of breath.  Denies any swelling in his legs.  Denies any fever or cough.  States that the pain has slowly improved with throughout the morning.  States that something similar happened to him several years ago but he was never evaluated by a doctor. Denies any history of VTE, any recent hospitalization or surgery, any recent long travel in car or plane, hormone use or cancer history.  The history is provided by the patient.  Chest Pain      Prior to Admission medications   Medication Sig Start Date End Date Taking? Authorizing Provider  acetaminophen  (TYLENOL ) 325 MG tablet Take by mouth. 09/14/18   [provider]  ciprofloxacin  (CILOXAN ) 0.3 % ophthalmic solution Place 2 drops into both eyes every 4 (four) hours while awake. For 7 days 01/28/22   Carlee Charters, NP  erythromycin  ophthalmic ointment Place a 1/2 inch ribbon of ointment into the eye 4 times daily for 7 days. 05/22/23   Dodson Freestone, FNP  HYDROcodone -acetaminophen  (NORCO) 5-325 MG tablet Take 2 tablets by mouth every 4 (four) hours as  needed. Patient not taking: Reported on 05/22/2023 03/23/22   Zelaya, Oscar A, PA-C  methocarbamol  (ROBAXIN ) 500 MG tablet Take 1 tablet (500 mg total) by mouth 3 (three) times daily as needed for muscle spasms. Patient not taking: Reported on 05/22/2023 03/23/22   Zelaya, Oscar A, PA-C  Multiple Vitamin (MULTIVITAMIN WITH MINERALS) TABS tablet Take 1 tablet by mouth daily. Men's Mega multivitamin    [provider]  naproxen  (NAPROSYN ) 375 MG tablet Take 1 tablet (375 mg total) by mouth 2 (two) times daily with a meal. Patient not taking: Reported on 05/22/2023 03/23/22   Zelaya, Oscar A, PA-C  Oxycodone  HCl 10 MG TABS Take by mouth. Patient not taking: Reported on 05/22/2023 09/14/18   [provider]    Allergies: Patient has no known allergies.    Review of Systems  Cardiovascular:  Positive for chest pain.    Updated Vital Signs BP 138/62 (BP Location: Left Arm)   Pulse 70   Temp 97.7 F (36.5 C) (Oral)   Resp 15   Ht 5' 8 (1.727 m)   Wt 72.6 kg   SpO2 100%   BMI 24.34 kg/m   Physical Exam Vitals and nursing note reviewed.  Constitutional:      General: He is not in acute distress.    Appearance: He is well-developed.  HENT:  Head: Normocephalic.   Eyes:     Extraocular Movements: Extraocular movements intact.    Cardiovascular:     Rate and Rhythm: Normal rate and regular rhythm.     Pulses:          Radial pulses are 2+ on the right side and 2+ on the left side.       Dorsalis pedis pulses are 2+ on the right side and 2+ on the left side.     Heart sounds: Normal heart sounds.  Pulmonary:     Effort: Pulmonary effort is normal.     Breath sounds: Normal breath sounds.  Chest:     Chest wall: Tenderness (L-sided chest wall, no overlying skin changes) present.  Abdominal:     Palpations: Abdomen is soft.     Tenderness: There is no abdominal tenderness.   Musculoskeletal:        General: Normal range of motion.     Cervical back: Normal  range of motion and neck supple.     Right lower leg: No edema.     Left lower leg: Tenderness (Posterior L knee and L thigh) present. No edema.   Skin:    General: Skin is warm and dry.   Neurological:     General: No focal deficit present.     Mental Status: He is alert and oriented to person, place, and time.   Psychiatric:        Mood and Affect: Mood normal.        Behavior: Behavior normal.     (all labs ordered are listed, but only abnormal results are displayed) Labs Reviewed  CBC WITH DIFFERENTIAL/PLATELET - Abnormal; Notable for the following components:      Result Value   Monocytes Absolute 1.1 (*)    All other components within normal limits  BASIC METABOLIC PANEL WITH GFR - Abnormal; Notable for the following components:   CO2 21 (*)    Glucose, Bld 68 (*)    Calcium  8.6 (*)    All other components within normal limits  CBG MONITORING, ED - Abnormal; Notable for the following components:   Glucose-Capillary 125 (*)    All other components within normal limits  TROPONIN I (HIGH SENSITIVITY)  TROPONIN I (HIGH SENSITIVITY)    EKG: None  Radiology: DG Knee Complete 4 Views Left Result Date: 08/13/2023 CLINICAL DATA:  Radiating leg pain EXAM: LEFT KNEE - COMPLETE 4 VIEW COMPARISON:  None Available. FINDINGS: No fracture or dislocation. Mild joint space loss of the mediolateral compartments. Minimal osteophytes of all 3 compartments. No joint effusion on lateral view. Degenerative changes of the proximal tibiofibular joint. IMPRESSION: Mild degenerative changes. Electronically Signed   By: Adrianna Horde M.D.   On: 08/13/2023 13:13   DG Chest 2 View Result Date: 08/13/2023 CLINICAL DATA:  Chest pain EXAM: CHEST - 2 VIEW COMPARISON:  X-ray 09/12/2018 FINDINGS: Hyperinflation. No consolidation, pneumothorax or effusion. No edema. Normal cardiopericardial silhouette. Degenerative changes along the spine. Overlapping gown snaps. IMPRESSION: Hyperinflation.  No acute  cardiopulmonary disease. Electronically Signed   By: Adrianna Horde M.D.   On: 08/13/2023 13:12     Procedures   Medications Ordered in the ED  ibuprofen  (ADVIL ) tablet 400 mg (has no administration in time range)    Clinical Course as of 08/13/23 1557  Wed Aug 13, 2023  1301 Mild hypoglycemia, will be given food and reassess. Initial troponin negative. [VK]  1531 Repeat troponin negative, US  negative for DVT. Suspect  MSK pain. Patient is stable for discharge home with outpatient follow up. [VK]    Clinical Course User Index [VK] Kingsley, Yolani Vo K, DO                                 Medical Decision Making This patient presents to the ED with chief complaint(s) of chest pain with pertinent past medical history of tobacco use which further complicates the presenting complaint. The complaint involves an extensive differential diagnosis and also carries with it a high risk of complications and morbidity.    The differential diagnosis includes ACS, arrhythmia, anemia, pneumonia, pneumothorax, pulmonary edema, pleural effusion, MSK pain, considering possible VTE with lower extremity pain though no swelling or known VTE risk factors, arthritis, equal pulses in all 4 extremities without neurologic deficits making a dissection unlikely  Additional history obtained: Additional history obtained from N/A Records reviewed N/A  ED Course and Reassessment: On patient's arrival he is hemodynamically stable in no acute distress.  Patient will have EKG, labs, chest x-ray and DVT ultrasound as well as x-ray of his left knee performed to evaluate for cause of his symptoms.  He declined any pain control at this time and will be closely reassessed.  Independent labs interpretation:  The following labs were independently interpreted: mild hypoglycemia, normalized on repeat otherwise within normal range  Independent visualization of imaging: - I independently visualized the following imaging with scope  of interpretation limited to determining acute life threatening conditions related to emergency care: CXR, L Knee XR, DVT US , which revealed no acute disease; arthritis of the L knee  Consultation: - Consulted or discussed management/test interpretation w/ external professional: N/A  Consideration for admission or further workup: Patient has no emergent conditions requiring admission or further work-up at this time and is stable for discharge home with primary care follow-up  Social Determinants of health: no PCP    Amount and/or Complexity of Data Reviewed Labs: ordered. Radiology: ordered.  Risk OTC drugs.       Final diagnoses:  Chest wall pain  Arthritis of left knee    ED Discharge Orders     None          Nolberto Batty, DO 08/13/23 1557

## 2023-10-13 ENCOUNTER — Ambulatory Visit
Admission: EM | Admit: 2023-10-13 | Discharge: 2023-10-13 | Disposition: A | Discharge status: Home / Self Care | Payer: Self-pay | Attending: Nurse Practitioner | Admitting: Nurse Practitioner

## 2023-10-13 ENCOUNTER — Encounter: Payer: Self-pay | Admitting: Emergency Medicine

## 2023-10-13 DIAGNOSIS — T63481A Toxic effect of venom of other arthropod, accidental (unintentional), initial encounter: Secondary | ICD-10-CM

## 2023-10-13 DIAGNOSIS — T63461A Toxic effect of venom of wasps, accidental (unintentional), initial encounter: Secondary | ICD-10-CM

## 2023-10-13 MED ORDER — DEXAMETHASONE SODIUM PHOSPHATE 10 MG/ML IJ SOLN
10.0000 mg | Freq: Once | INTRAMUSCULAR | Status: AC
  Administered 2023-10-13: 10 mg via INTRAMUSCULAR

## 2023-10-13 MED ORDER — TRIAMCINOLONE ACETONIDE 0.1 % EX CREA
1.0000 | TOPICAL_CREAM | Freq: Two times a day (BID) | CUTANEOUS | 0 refills | Status: AC
Start: 2023-10-13 — End: ?

## 2023-10-13 MED ORDER — METHYLPREDNISOLONE 4 MG PO TBPK: ORAL_TABLET | ORAL | 0 refills | Status: AC

## 2023-10-13 NOTE — ED Triage Notes (Signed)
 Pt st's he was stung by bees 3 days ago  Pt here due to pain and swelling to right arm.
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
## 2023-10-13 NOTE — Discharge Instructions (Addendum)
 You were seen today for multiple yellowjacket stings that caused swelling, itching, and burning at the sting sites. There are no signs of a severe allergic reaction. You received a Decadron  (steroid) injection in the office. You have also been prescribed a Medrol  Dosepak (steroid) to take as directed and triamcinolone  cream to apply to the affected areas twice daily for up to two weeks or until the symptoms improve. You may continue taking Benadryl as needed for itching.  At home, you can apply cool compresses or take short ice baths to help reduce swelling and discomfort. Keeping the area clean and avoiding scratching will also help prevent irritation and possible infection. Symptoms from insect stings usually improve over several days.  Follow up with your primary care provider if your symptoms do not improve as expected or if they worsen. Go to the emergency department immediately if you develop difficulty breathing, swelling of your lips, tongue, or throat, dizziness, chest tightness, or any other signs of a severe allergic reaction.

## 2024-01-25 ENCOUNTER — Ambulatory Visit
Admission: EM | Admit: 2024-01-25 | Discharge: 2024-01-25 | Disposition: A | Discharge status: Home / Self Care | Payer: Self-pay

## 2024-01-25 ENCOUNTER — Encounter: Payer: Self-pay | Admitting: Emergency Medicine

## 2024-01-25 DIAGNOSIS — S29012A Strain of muscle and tendon of back wall of thorax, initial encounter: Secondary | ICD-10-CM

## 2024-01-25 MED ORDER — DICLOFENAC SODIUM 75 MG PO TBEC: 75.0000 mg | DELAYED_RELEASE_TABLET | Freq: Two times a day (BID) | ORAL | 0 refills | Status: AC

## 2024-01-25 MED ORDER — CYCLOBENZAPRINE HCL 5 MG PO TABS: 5.0000 mg | ORAL_TABLET | Freq: Three times a day (TID) | ORAL | 0 refills | Status: AC | PRN
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
## 2024-01-25 NOTE — Discharge Instructions (Signed)

## 2024-01-25 NOTE — ED Triage Notes (Signed)
 Pt presents c/o back pain x 3 days. Pt states,  My back hurts from my shoulders down to my pelvis.I am having a tough time getting rest. I ain't fell or nothing like that or lifted nothing heavy. I do a little yard work after work but [facility] that I lift not that heavy.  When I cough it makes the pain in my back worse.   Pt denies fall and/or injury.
